# Patient Record
Sex: Female | Born: 1960 | Race: White | Hispanic: No | Marital: Married | Smoking: Former smoker
Health system: Southern US, Community
[De-identification: ages and names within clinical notes are randomized; demographics above are authoritative.]

## PROBLEM LIST (undated history)

## (undated) DIAGNOSIS — F419 Anxiety disorder, unspecified: Secondary | ICD-10-CM

## (undated) DIAGNOSIS — B001 Herpesviral vesicular dermatitis: Secondary | ICD-10-CM

## (undated) DIAGNOSIS — Z9889 Other specified postprocedural states: Secondary | ICD-10-CM

## (undated) DIAGNOSIS — G4730 Sleep apnea, unspecified: Secondary | ICD-10-CM

## (undated) DIAGNOSIS — IMO0002 Reserved for concepts with insufficient information to code with codable children: Secondary | ICD-10-CM

## (undated) HISTORY — DX: Reserved for concepts with insufficient information to code with codable children: IMO0002

## (undated) HISTORY — DX: Herpesviral vesicular dermatitis: B00.1

## (undated) HISTORY — PX: BARIATRIC SURGERY: SHX1103

## (undated) HISTORY — DX: Other specified postprocedural states: Z98.890

## (undated) HISTORY — PX: TUBAL LIGATION: SHX77

## (undated) HISTORY — DX: Sleep apnea, unspecified: G47.30

## (undated) HISTORY — DX: Anxiety disorder, unspecified: F41.9

---

## 1998-06-03 ENCOUNTER — Other Ambulatory Visit: Admission: RE | Admit: 1998-06-03 | Discharge: 1998-06-03 | Payer: Self-pay | Admitting: Obstetrics and Gynecology

## 2001-04-14 ENCOUNTER — Other Ambulatory Visit: Admission: RE | Admit: 2001-04-14 | Discharge: 2001-04-14 | Payer: Self-pay | Admitting: Obstetrics and Gynecology

## 2002-04-24 ENCOUNTER — Encounter: Admission: RE | Admit: 2002-04-24 | Discharge: 2002-04-24 | Payer: Self-pay | Admitting: Obstetrics and Gynecology

## 2002-04-24 ENCOUNTER — Encounter: Payer: Self-pay | Admitting: Obstetrics and Gynecology

## 2003-06-03 ENCOUNTER — Encounter: Payer: Self-pay | Admitting: Obstetrics and Gynecology

## 2003-06-03 ENCOUNTER — Encounter: Admission: RE | Admit: 2003-06-03 | Discharge: 2003-06-03 | Payer: Self-pay | Admitting: Obstetrics and Gynecology

## 2004-06-23 ENCOUNTER — Encounter: Admission: RE | Admit: 2004-06-23 | Discharge: 2004-06-23 | Payer: Self-pay | Admitting: Obstetrics and Gynecology

## 2005-08-27 ENCOUNTER — Encounter: Admission: RE | Admit: 2005-08-27 | Discharge: 2005-08-27 | Payer: Self-pay | Admitting: Obstetrics and Gynecology

## 2006-08-30 ENCOUNTER — Encounter: Admission: RE | Admit: 2006-08-30 | Discharge: 2006-08-30 | Payer: Self-pay | Admitting: Family Medicine

## 2007-09-05 ENCOUNTER — Encounter: Admission: RE | Admit: 2007-09-05 | Discharge: 2007-09-05 | Payer: Self-pay | Admitting: Family Medicine

## 2008-09-10 ENCOUNTER — Encounter: Admission: RE | Admit: 2008-09-10 | Discharge: 2008-09-10 | Payer: Self-pay | Admitting: Family Medicine

## 2009-09-16 ENCOUNTER — Encounter: Admission: RE | Admit: 2009-09-16 | Discharge: 2009-09-16 | Payer: Self-pay | Admitting: Family Medicine

## 2010-09-22 ENCOUNTER — Encounter
Admission: RE | Admit: 2010-09-22 | Discharge: 2010-09-22 | Payer: Self-pay | Source: Home / Self Care | Attending: Family Medicine | Admitting: Family Medicine

## 2011-08-27 ENCOUNTER — Other Ambulatory Visit: Payer: Self-pay | Admitting: Family Medicine

## 2011-08-27 DIAGNOSIS — Z1231 Encounter for screening mammogram for malignant neoplasm of breast: Secondary | ICD-10-CM

## 2011-10-05 ENCOUNTER — Ambulatory Visit: Payer: Self-pay

## 2011-10-12 ENCOUNTER — Ambulatory Visit
Admission: RE | Admit: 2011-10-12 | Discharge: 2011-10-12 | Disposition: A | Discharge status: Home / Self Care | Payer: Federal, State, Local not specified - PPO | Source: Ambulatory Visit | Attending: Family Medicine | Admitting: Family Medicine

## 2011-10-12 DIAGNOSIS — Z1231 Encounter for screening mammogram for malignant neoplasm of breast: Secondary | ICD-10-CM

## 2011-11-02 LAB — HM PAP SMEAR: HM Pap smear: NEGATIVE

## 2011-11-13 DIAGNOSIS — IMO0002 Reserved for concepts with insufficient information to code with codable children: Secondary | ICD-10-CM

## 2011-11-13 HISTORY — DX: Reserved for concepts with insufficient information to code with codable children: IMO0002

## 2011-12-07 DIAGNOSIS — Z9889 Other specified postprocedural states: Secondary | ICD-10-CM

## 2011-12-07 HISTORY — DX: Other specified postprocedural states: Z98.890

## 2012-01-25 LAB — HM COLONOSCOPY

## 2012-09-08 ENCOUNTER — Other Ambulatory Visit: Payer: Self-pay | Admitting: Family Medicine

## 2012-09-08 DIAGNOSIS — Z1231 Encounter for screening mammogram for malignant neoplasm of breast: Secondary | ICD-10-CM

## 2012-10-17 ENCOUNTER — Ambulatory Visit
Admission: RE | Admit: 2012-10-17 | Discharge: 2012-10-17 | Disposition: A | Discharge status: Home / Self Care | Payer: Federal, State, Local not specified - PPO | Source: Ambulatory Visit | Attending: Family Medicine | Admitting: Family Medicine

## 2012-10-17 DIAGNOSIS — Z1231 Encounter for screening mammogram for malignant neoplasm of breast: Secondary | ICD-10-CM

## 2013-01-06 ENCOUNTER — Encounter: Payer: Self-pay | Admitting: Family Medicine

## 2013-01-06 DIAGNOSIS — F419 Anxiety disorder, unspecified: Secondary | ICD-10-CM | POA: Insufficient documentation

## 2013-01-07 ENCOUNTER — Encounter: Payer: Self-pay | Admitting: Physician Assistant

## 2013-01-07 ENCOUNTER — Encounter: Payer: Self-pay | Admitting: Family Medicine

## 2013-01-07 ENCOUNTER — Ambulatory Visit (INDEPENDENT_AMBULATORY_CARE_PROVIDER_SITE_OTHER): Payer: Federal, State, Local not specified - PPO | Admitting: Physician Assistant

## 2013-01-07 VITALS — BP 116/74 | HR 72 | Temp 98.4°F | Resp 18 | Ht 62.0 in | Wt 178.0 lb

## 2013-01-07 DIAGNOSIS — B977 Papillomavirus as the cause of diseases classified elsewhere: Secondary | ICD-10-CM

## 2013-01-07 DIAGNOSIS — Z9889 Other specified postprocedural states: Secondary | ICD-10-CM

## 2013-01-07 DIAGNOSIS — R8789 Other abnormal findings in specimens from female genital organs: Secondary | ICD-10-CM

## 2013-01-07 DIAGNOSIS — IMO0002 Reserved for concepts with insufficient information to code with codable children: Secondary | ICD-10-CM

## 2013-01-07 DIAGNOSIS — F419 Anxiety disorder, unspecified: Secondary | ICD-10-CM

## 2013-01-07 DIAGNOSIS — Z0000 Encounter for general adult medical examination without abnormal findings: Secondary | ICD-10-CM

## 2013-01-07 DIAGNOSIS — F411 Generalized anxiety disorder: Secondary | ICD-10-CM

## 2013-01-07 MED ORDER — BUPROPION HCL ER (XL) 300 MG PO TB24: 300.0000 mg | ORAL_TABLET | Freq: Every day | ORAL | Status: DC

## 2013-01-07 NOTE — Progress Notes (Signed)
Patient ID: Chloe Newton MRN: [id number], DOB: [birth year], 52 y.o. Date of Encounter: 01/07/2013,   Chief Complaint: Physical (CPE)  HPI: 52 y.o. y/o female here with history of noted below here for CPE.  Doing well.  Is on Wellbutrin for anxiety. Says this is working perfectly. Does not want to try to decrease or stop med.   No other complaints.   Review of Systems: Consitutional: No fever, chills, fatigue, night sweats, lymphadenopathy. No significant/unexplained weight changes. Eyes: No visual changes, eye redness, or discharge. ENT/Mouth: No ear pain, sore throat, nasal drainage, or sinus pain. Cardiovascular: No chest pressure,heaviness, tightness or squeezing, even with exertion. No increased shortness of breath or dyspnea on exertion.No palpitations, edema, orthopnea, PND. Respiratory: No cough, hemoptysis, SOB, or wheezing. Gastrointestinal: No anorexia, dysphagia, reflux, pain, nausea, vomiting, hematemesis, diarrhea, constipation, BRBPR, or melena. Breast: No mass, nodules, bulging, or retraction. No skin changes or inflammation. No nipple discharge. No lymphadenopathy. Genitourinary: No dysuria, hematuria, incontinence, vaginal discharge, pruritis, burning, abnormal bleeding, or pain. Musculoskeletal: No decreased ROM, No joint pain or swelling. No significant pain in neck, back, or extremities. Skin: No rash, pruritis, or concerning lesions. Neurological: No headache, dizziness, syncope, seizures, tremors, memory loss, coordination problems, or paresthesias. Psychological: No anxiety, depression, hallucinations, SI/HI. Endocrine: No polydipsia, polyphagia, polyuria, or known diabetes.No increased fatigue. No palpitations/rapid heart rate. No significant/unexplained weight change. All other systems were reviewed and are otherwise negative.  Past Medical History  Diagnosis Date  . Anxiety   . Abnormal finding on Pap smear, HPV DNA positive 11/13/11    Normal Cytology.  Positive HPV 16, 18  . History of colposcopy with cervical biopsy 12/07/11     Past Surgical History  Procedure Laterality Date  . Tubal ligation    . Cesarean section      Home Meds:  No current outpatient prescriptions on file prior to visit.   No current facility-administered medications on file prior to visit.    Allergies: No Known Allergies  History   Social History  . Marital Status: Married    Spouse Name: N/A    Number of Children: N/A  . Years of Education: N/A   Occupational History  . Not on file.   Social History Main Topics  . Smoking status: Former Games developer  . Smokeless tobacco: Not on file  . Alcohol Use: Yes  . Drug Use: No  . Sexually Active: Not on file   Other Topics Concern  . Not on file   Social History Narrative  . No narrative on file    Family History  Problem Relation Age of Onset  . COPD Father   . Diabetes Maternal Grandmother   . Heart disease Maternal Grandfather   . Cancer Paternal Grandmother   . Cancer Paternal Grandfather     testicular  . Hypertension Neg Hx     Physical Exam: Blood pressure 116/74, pulse 72, temperature 98.4 F (36.9 C), temperature source Oral, resp. rate 18, height 5\' 2"  (1.575 m), weight 178 lb (80.74 kg), last menstrual period 12/26/2012., Body mass index is 32.55 kg/(m^2). General: Well developed, well nourished,WF. in no acute distress. HEENT: Normocephalic, atraumatic. Conjunctiva pink, sclera non-icteric. Pupils 2 mm constricting to 1 mm, round, regular, and equally reactive to light and accomodation. EOMI. Internal auditory canal clear. TMs with good cone of light and without pathology. Nasal mucosa pink. Nares are without discharge. No sinus tenderness. Oral mucosa pink.  Pharynx without exudate.   Neck: Supple. Trachea midline. No  thyromegaly. Full ROM. No lymphadenopathy.No Carotid Bruits. Lungs: Clear to auscultation bilaterally without wheezes, rales, or rhonchi. Breathing is of normal effort  and unlabored. Cardiovascular: RRR with S1 S2. No murmurs, rubs, or gallops. Distal pulses 2+ symmetrically. No carotid or abdominal bruits. Breast: Symmetrical. No masses. Nipples without discharge. Abdomen: Soft, non-tender, non-distended with normoactive bowel sounds. No hepatosplenomegaly or masses. No rebound/guarding. No CVA tenderness. No hernias.  Genitourinary:  External genitalia without lesions. Vaginal mucosa pink.No discharge present. Cervix pink and without discharge. No cervical tenderness.Normal uterus size. No adnexal mass or tenderness.  Pap smear taken Musculoskeletal: Full range of motion and 5/5 strength throughout. Without swelling, atrophy, tenderness, crepitus, or warmth. Extremities without clubbing, cyanosis, or edema. Calves supple. Skin: Warm and moist without erythema, ecchymosis, wounds, or rash. Neuro: A+Ox3. CN II-XII grossly intact. Moves all extremities spontaneously. Full sensation throughout. Normal gait. DTR 2+ throughout upper and lower extremities. Finger to nose intact. Psych:  Responds to questions appropriately with a normal affect.    Immunization History  Administered Date(s) Administered  . Tdap 01/11/2006    Assessment/Plan:  52 y.o. y/o female here for CPE 1. Encounter for preventive health examination   Screening Mammogram: 10/17/12 Negative  Screening Colonoscopy: 01/25/12   Pap smear: See Below. Repeat today -cytology and HPV.  Immunizations: Had Tdap 01/11/06  2. Anxiety Well controllled. Cont current med. - buPROPion (WELLBUTRIN XL) 300 MG 24 hr tablet; Take 1 tablet (300 mg total) by mouth daily.  Dispense: 90 tablet; Refill: 3  3. 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 Had f/u colposcopy with Dr Ambrose Mantle - PAP, Thin Prep w/HPV rflx HPV Type 16/18  4. History of colposcopy with cervical biopsy This was done 12/07/11 by Dr Ambrose Mantle. Pt says she has had no f/u since  12/07/11.  5.H/O Recurrent BV-cont Boric Acid intravaginal. Rx given for; Boric Acid Intravaginal Gelatin Capsule 600 mg one Intravaginally QDprn   Signed, [address] [place], [place], [facility] 01/07/2013 10:39 AM

## 2013-01-09 LAB — PAP, THIN PREP W/HPV RFLX HPV TYPE 16/18: HPV DNA High Risk: NOT DETECTED

## 2013-03-09 ENCOUNTER — Encounter: Payer: Self-pay | Admitting: Physician Assistant

## 2013-03-09 ENCOUNTER — Ambulatory Visit (INDEPENDENT_AMBULATORY_CARE_PROVIDER_SITE_OTHER): Payer: Federal, State, Local not specified - PPO | Admitting: Physician Assistant

## 2013-03-09 VITALS — BP 118/80 | HR 68 | Temp 98.3°F | Resp 18 | Ht 63.5 in | Wt 176.0 lb

## 2013-03-09 DIAGNOSIS — M545 Low back pain, unspecified: Secondary | ICD-10-CM

## 2013-03-09 MED ORDER — MELOXICAM 7.5 MG PO TABS: 7.5000 mg | ORAL_TABLET | Freq: Every day | ORAL | Status: DC

## 2013-03-09 MED ORDER — PREDNISONE 20 MG PO TABS: ORAL_TABLET | ORAL | Status: DC

## 2013-03-09 MED ORDER — METAXALONE 800 MG PO TABS: 800.0000 mg | ORAL_TABLET | Freq: Three times a day (TID) | ORAL | Status: DC

## 2013-03-09 NOTE — Progress Notes (Signed)
Patient ID: LECIA ESPERANZA MRN: [id number], DOB: 23-Oct-1960, 52 y.o. Date of Encounter: 03/09/2013, 3:48 PM    Chief Complaint:  Chief Complaint  Patient presents with  . c/o low back pain radiating down into legs     HPI: 52 y.o. year old white female reports that she has no prior h/o back pain or back injury/surgery prior to this episode. This began "around Easter"- late March. Says she had lifted her grandchild 1-2 days prior to onset of symtoms. Other than that, she had no other injury/trauma. Had done no other lifting, bending, or other activity to possibly cause back pain. Had no sudden onset/acute onset of symptoms. Had achy discomfort across low back since Easter. Then, for past one week pain has radiated down back of both thighs to knee level. 2 days ago she started to notice left foot tingling some.  She uses ibuprofen and pain decreases from 4/10 to 2.5/10.  She was walking at least 2 miles QD prior to this but this has limited her exercise. 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 No other acitivity-no lifting, bending, etc.  Her pain is worst when standing still. Pain is better when walking and moving and when sitting, lying down.   No unexplained weight loss. No other area of pain. No bowel or bladder incontinence.   Home Meds: See attached medication section for any medications that were entered at today's visit. The computer does not put those onto this list.The following list is a list of meds entered prior to today's visit.   Current Outpatient Prescriptions on File Prior to Visit  Medication Sig Dispense Refill  . buPROPion (WELLBUTRIN XL) 300 MG 24 hr tablet Take 1 tablet (300 mg total) by mouth daily.  90 tablet  3  . docusate sodium (COLACE) 100 MG capsule Take 100 mg by mouth as needed for constipation.      . Omega-3 Fatty Acids (FISH OIL) 500 MG CAPS Take 1 capsule by mouth daily.        No current facility-administered medications on file prior to visit.    Allergies: No Known Allergies    Review of Systems: See HPI for pertinent ROS. All other ROS negative.    Physical Exam: Blood pressure 118/80, pulse 68, temperature 98.3 F (36.8 C), temperature source Oral, resp. rate 18, height 5' 3.5" (1.613 m), weight 176 lb (79.833 kg), last menstrual period 02/28/2013., Body mass index is 30.68 kg/(m^2). General: WNWD WF. Tanned. Appears in no acute distress. Lungs: Clear bilaterally to auscultation without wheezes, rales, or rhonchi. Breathing is unlabored. Heart: Regular rhythm. No murmurs, rubs, or gallops. Msk:  Strength and tone normal for age. Low Back: No pain with palpation. Left Sciatic Notch: Positive tenderness with palpation. Right Sciatic Notch: no tenderness with palpation.  Left SLR: She says left low back/buttock feels very tight when she does this. Left hip abduction:nml.  Right SLR and hip abduction: nml. 2+ bilateral patella reflexes. 5/5 strength of legs and ankles bialterally Neuro: Alert and oriented X 3. Moves all extremities spontaneously. Gait is normal. CNII-XII grossly in tact. Psych:  Responds to questions appropriately with a normal affect.     ASSESSMENT AND PLAN:  52 y.o. year old female with  1. Low back pain with left sciatica  - predniSONE (DELTASONE) 20 MG tablet; Take 3 daily for 2 days, then 2 daily for 2 days, then 1 daily for  2 days.  Dispense: 12 tablet; Refill: 0 - metaxalone (SKELAXIN) 800 MG tablet; Take 1 tablet (800 mg total) by mouth 3 (three) times daily.  Dispense: 40 tablet; Refill: 1 - meloxicam (MOBIC) 7.5 MG tablet; Take 1 tablet (7.5 mg total) by mouth daily.  Dispense: 30 tablet; Refill: 0  Cautioned reg poss drowsiness with Skelaxin. If this occurs, just use this at night.  She will not start mobic until after prednisone is complete. Take with food.  Heat. Stretch. Demonstrated multiple stretches for her to do.  Also, she has yoga DVD and we discussed specific yoga stretches to do.  If symptoms not resolved in 2 weeks, f/u then will consider XRay if needed.   [id number] [place], [place], [facility] 03/09/2013 3:48 PM

## 2013-10-20 ENCOUNTER — Other Ambulatory Visit: Payer: Self-pay

## 2013-10-20 DIAGNOSIS — Z1231 Encounter for screening mammogram for malignant neoplasm of breast: Secondary | ICD-10-CM

## 2013-11-06 ENCOUNTER — Ambulatory Visit
Admission: RE | Admit: 2013-11-06 | Discharge: 2013-11-06 | Disposition: A | Discharge status: Home / Self Care | Payer: Federal, State, Local not specified - PPO | Source: Ambulatory Visit

## 2013-11-06 DIAGNOSIS — Z1231 Encounter for screening mammogram for malignant neoplasm of breast: Secondary | ICD-10-CM

## 2014-02-23 ENCOUNTER — Other Ambulatory Visit: Payer: Federal, State, Local not specified - PPO

## 2014-02-23 DIAGNOSIS — Z0000 Encounter for general adult medical examination without abnormal findings: Secondary | ICD-10-CM

## 2014-02-23 DIAGNOSIS — Z79899 Other long term (current) drug therapy: Secondary | ICD-10-CM

## 2014-02-23 DIAGNOSIS — E785 Hyperlipidemia, unspecified: Secondary | ICD-10-CM

## 2014-02-23 DIAGNOSIS — F411 Generalized anxiety disorder: Secondary | ICD-10-CM

## 2014-02-23 LAB — COMPLETE METABOLIC PANEL WITH GFR
ALBUMIN: 3.6 g/dL (ref 3.5–5.2)
ALK PHOS: 39 U/L (ref 39–117)
ALT: 41 U/L — ABNORMAL HIGH (ref 0–35)
AST: 49 U/L — AB (ref 0–37)
BILIRUBIN TOTAL: 0.4 mg/dL (ref 0.2–1.2)
BUN: 12 mg/dL (ref 6–23)
CALCIUM: 8.4 mg/dL (ref 8.4–10.5)
CHLORIDE: 109 meq/L (ref 96–112)
CO2: 29 meq/L (ref 19–32)
Creat: 0.77 mg/dL (ref 0.50–1.10)
GFR, Est African American: >89 mL/min
GFR, Est Non African American: 88 mL/min
Glucose, Bld: 82 mg/dL (ref 70–99)
Potassium: 4.4 mEq/L (ref 3.5–5.3)
SODIUM: 143 meq/L (ref 135–145)
Total Protein: 5.8 g/dL — ABNORMAL LOW (ref 6.0–8.3)

## 2014-02-23 LAB — CBC WITH DIFFERENTIAL/PLATELET
BASOS ABS: 0 10*3/uL (ref 0.0–0.1)
BASOS PCT: 0 % (ref 0–1)
EOS ABS: 0.2 10*3/uL (ref 0.0–0.7)
Eosinophils Relative: 3 % (ref 0–5)
HEMATOCRIT: 36.8 % (ref 36.0–46.0)
HEMOGLOBIN: 12.4 g/dL (ref 12.0–15.0)
LYMPHS ABS: 1.4 10*3/uL (ref 0.7–4.0)
LYMPHS PCT: 20 % (ref 12–46)
MCH: 30.2 pg (ref 26.0–34.0)
MCHC: 33.7 g/dL (ref 30.0–36.0)
MCV: 89.8 fL (ref 78.0–100.0)
MONO ABS: 1.1 10*3/uL — AB (ref 0.1–1.0)
MONOS PCT: 16 % — AB (ref 3–12)
Neutro Abs: 4.2 10*3/uL (ref 1.7–7.7)
Neutrophils Relative %: 61 % (ref 43–77)
Platelets: 270 10*3/uL (ref 150–400)
RBC: 4.1 MIL/uL (ref 3.87–5.11)
RDW: 12.9 % (ref 11.5–15.5)
WBC: 6.9 10*3/uL (ref 4.0–10.5)

## 2014-02-23 LAB — LIPID PANEL
CHOL/HDL RATIO: 1.8 ratio
CHOLESTEROL: 147 mg/dL (ref 0–200)
HDL: 80 mg/dL (ref >39)
LDL CALC: 53 mg/dL (ref 0–99)
TRIGLYCERIDES: 70 mg/dL (ref <150)
VLDL: 14 mg/dL (ref 0–40)

## 2014-02-23 LAB — TSH: TSH: 1.765 u[IU]/mL (ref 0.350–4.500)

## 2014-02-24 LAB — VITAMIN D 25 HYDROXY (VIT D DEFICIENCY, FRACTURES): VIT D 25 HYDROXY: 32 ng/mL (ref 30–89)

## 2014-02-26 ENCOUNTER — Telehealth: Payer: Self-pay | Admitting: Physician Assistant

## 2014-02-26 ENCOUNTER — Encounter: Payer: Federal, State, Local not specified - PPO | Admitting: Family Medicine

## 2014-02-26 DIAGNOSIS — F419 Anxiety disorder, unspecified: Secondary | ICD-10-CM

## 2014-02-26 MED ORDER — BUPROPION HCL ER (XL) 300 MG PO TB24: 300.0000 mg | ORAL_TABLET | Freq: Every day | ORAL | Status: DC
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
## 2014-02-26 NOTE — Telephone Encounter (Signed)
One month supply sent

## 2014-03-08 ENCOUNTER — Encounter: Payer: Self-pay | Admitting: Physician Assistant

## 2014-03-08 ENCOUNTER — Ambulatory Visit (INDEPENDENT_AMBULATORY_CARE_PROVIDER_SITE_OTHER): Payer: Federal, State, Local not specified - PPO | Admitting: Physician Assistant

## 2014-03-08 VITALS — BP 124/78 | HR 64 | Temp 97.9°F | Resp 18 | Ht 61.5 in | Wt 192.0 lb

## 2014-03-08 DIAGNOSIS — F419 Anxiety disorder, unspecified: Secondary | ICD-10-CM

## 2014-03-08 DIAGNOSIS — IMO0002 Reserved for concepts with insufficient information to code with codable children: Secondary | ICD-10-CM

## 2014-03-08 DIAGNOSIS — B977 Papillomavirus as the cause of diseases classified elsewhere: Secondary | ICD-10-CM

## 2014-03-08 DIAGNOSIS — E669 Obesity, unspecified: Secondary | ICD-10-CM

## 2014-03-08 DIAGNOSIS — Z0000 Encounter for general adult medical examination without abnormal findings: Secondary | ICD-10-CM

## 2014-03-08 DIAGNOSIS — R8789 Other abnormal findings in specimens from female genital organs: Secondary | ICD-10-CM

## 2014-03-08 DIAGNOSIS — Z9889 Other specified postprocedural states: Secondary | ICD-10-CM

## 2014-03-08 DIAGNOSIS — F411 Generalized anxiety disorder: Secondary | ICD-10-CM

## 2014-03-08 MED ORDER — BUPROPION HCL ER (XL) 300 MG PO TB24: 300.0000 mg | ORAL_TABLET | Freq: Every day | ORAL | Status: DC

## 2014-03-08 MED ORDER — LORCASERIN HCL 10 MG PO TABS: 1.0000 | ORAL_TABLET | Freq: Every day | ORAL | Status: DC

## 2014-03-08 MED ORDER — LORCASERIN HCL 10 MG PO TABS: 1.0000 | ORAL_TABLET | Freq: Two times a day (BID) | ORAL | Status: DC

## 2014-03-08 NOTE — Progress Notes (Signed)
Patient ID: Chloe Newton MRN: [id number], DOB: Apr 07, 1961, 53 y.o. Date of Encounter: 03/08/2014,   Chief Complaint: Physical (CPE)  HPI: 53 y.o. y/o female with history of noted below here for CPE.  Doing well.  Is on Wellbutrin for anxiety. Says this is working perfectly. Does not want to try to decrease or stop med.   Today she does report that she would like to try prescription for [facility] for weight loss. Says that she has done Weight Watchers and is still very compliant with following Weight Watchers. Says that she " tracks everything that goes into her mouth." Says that she is still eating the exact same as she was when she reached her goal with Weight Watchers. Says that that was 3 years ago and she did hit her goal of 152 at that time. Says that her activity has slowed down a little bit compared to then but her food intake is the same. Says that she currently walks 30-45 minutes 3 or 4 days a week. Says that several years ago she was doing this 7 days per week.  No other complaints.   Review of Systems: Consitutional: No fever, chills, fatigue, night sweats, lymphadenopathy. No significant/unexplained weight changes. Eyes: No visual changes, eye redness, or discharge. ENT/Mouth: No ear pain, sore throat, nasal drainage, or sinus pain. Cardiovascular: No chest pressure,heaviness, tightness or squeezing, even with exertion. No increased shortness of breath or dyspnea on exertion.No palpitations, edema, orthopnea, PND. Respiratory: No cough, hemoptysis, SOB, or wheezing. Gastrointestinal: No anorexia, dysphagia, reflux, pain, nausea, vomiting, hematemesis, diarrhea, constipation, BRBPR, or melena. Breast: No mass, nodules, bulging, or retraction. No skin changes or inflammation. No nipple discharge. No lymphadenopathy. Genitourinary: No dysuria, hematuria, incontinence, vaginal discharge, pruritis, burning, abnormal bleeding, or pain. Musculoskeletal: No decreased ROM, No joint  pain or swelling. No significant pain in neck, back, or extremities. Skin: No rash, pruritis, or concerning lesions. Neurological: No headache, dizziness, syncope, seizures, tremors, memory loss, coordination problems, or paresthesias. Psychological: No anxiety, depression, hallucinations, SI/HI. Endocrine: No polydipsia, polyphagia, polyuria, or known diabetes.No increased fatigue. No palpitations/rapid heart rate. No significant/unexplained weight change. All other systems were reviewed and are otherwise negative.  Past Medical History  Diagnosis Date  . Anxiety   . Abnormal finding on Pap smear, HPV DNA positive 11/13/11    Normal Cytology. Positive HPV 16, 18  . History of colposcopy with cervical biopsy 12/07/11     Past Surgical History  Procedure Laterality Date  . Tubal ligation    . Cesarean section      Home Meds:  Current Outpatient Prescriptions on File Prior to Visit  Medication Sig Dispense Refill  . buPROPion (WELLBUTRIN XL) 300 MG 24 hr tablet Take 1 tablet (300 mg total) by mouth daily.  30 tablet  0  . docusate sodium (COLACE) 100 MG capsule Take 100 mg by mouth as needed for constipation.       No current facility-administered medications on file prior to visit.    Allergies: No Known Allergies  History   Social History  . Marital Status: Married    Spouse Name: N/A    Number of Children: N/A  . Years of Education: N/A   Occupational History  . Not on file.   Social History Main Topics  . Smoking status: Former Games developer  . Smokeless tobacco: Never Used  . Alcohol Use: Yes  . Drug Use: No  . Sexual Activity: Not on file   Other Topics Concern  .  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   For exercise she walks 30-45 minutes 3-4 days per week. (As of 03/2014)    Family History  Problem Relation Age of Onset  . COPD Father   . Diabetes Maternal Grandmother   . Heart disease  Maternal Grandfather   . Cancer Paternal Grandmother   . Cancer Paternal Grandfather     testicular  . Hypertension Neg Hx     Physical Exam: Blood pressure 124/78, pulse 64, temperature 97.9 F (36.6 C), temperature source Oral, resp. rate 18, height 5' 1.5" (1.562 m), weight 192 lb (87.091 kg), last menstrual period 02/09/2014., Body mass index is 35.7 kg/(m^2). General: Well developed, well nourished,WF. in no acute distress. HEENT: Normocephalic, atraumatic. Conjunctiva pink, sclera non-icteric. Pupils 2 mm constricting to 1 mm, round, regular, and equally reactive to light and accomodation. EOMI. Internal auditory canal clear. TMs with good cone of light and without pathology. Nasal mucosa pink. Nares are without discharge. No sinus tenderness. Oral mucosa pink.  Pharynx without exudate.   Neck: Supple. Trachea midline. No thyromegaly. Full ROM. No lymphadenopathy.No Carotid Bruits. Lungs: Clear to auscultation bilaterally without wheezes, rales, or rhonchi. Breathing is of normal effort and unlabored. Cardiovascular: RRR with S1 S2. No murmurs, rubs, or gallops. Distal pulses 2+ symmetrically. No carotid or abdominal bruits. Breast: Symmetrical. No masses. Nipples without discharge. Abdomen: Soft, non-tender, non-distended with normoactive bowel sounds. No hepatosplenomegaly or masses. No rebound/guarding. No CVA tenderness. No hernias.  Genitourinary:  External genitalia without lesions. Vaginal mucosa pink.No discharge present. Cervix pink and without discharge. No cervical tenderness.Normal uterus size. No adnexal mass or tenderness.  Pap smear taken Musculoskeletal: Full range of motion and 5/5 strength throughout. Without swelling, atrophy, tenderness, crepitus, or warmth. Extremities without clubbing, cyanosis, or edema. Calves supple. Skin: Warm and moist without erythema, ecchymosis, wounds, or rash. Neuro: A+Ox3. CN II-XII grossly intact. Moves all extremities spontaneously. Full  sensation throughout. Normal gait. DTR 2+ throughout upper and lower extremities. Finger to nose intact. Psych:  Responds to questions appropriately with a normal affect.    Immunization History  Administered Date(s) Administered  . Tdap 01/11/2006    Assessment/Plan:  53 y.o. y/o female here for CPE 1. Encounter for preventive health examination   Screening Labs: She came in had fasting labs on 02/23/14. All labs were normal. Included CBC, CMET, FLP, TSH, vitamin D.  Screening Mammogram: 11/06/2013-- Negative  Screening Colonoscopy: 01/25/12   Pap smear: See Below. Repeat today -cytology and HPV.  Immunizations: Had Tdap 01/11/06  2. Anxiety Well controllled. Cont current med. - buPROPion (WELLBUTRIN XL) 300 MG 24 hr tablet; Take 1 tablet (300 mg total) by mouth daily.  Dispense: 90 tablet; Refill: 3  3. 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 Had f/u colposcopy with Dr Ambrose Mantle - PAP, Thin Prep w/HPV rflx HPV Type 16/18  4. History of colposcopy with cervical biopsy This was done 12/07/11 by Dr Ambrose Mantle. Pt says she has had no f/u since 12/07/11.  5.H/O Recurrent BV-cont Boric Acid intravaginal. Rx given for; Boric Acid Intravaginal Gelatin Capsule 600 mg one Intravaginally QDprn  6. Obesity (BMI 30-39.9) - Lorcaserin HCl (BELVIQ) 10 MG TABS; Take 1 tablet by mouth 2 (two) times daily.  Dispense: 60 tablet; Refill: 2 Told her to continue her diet and exercise the same as at present  so that we can monitor the effectiveness of the Belviq. She is to schedule followup office visit in 3 months so that we can follow up her weight and the Belviq.   [address]igned, [place] Beth Escobares[place], [place], [facility] 03/08/2014 2:21 PM

## 2014-03-10 ENCOUNTER — Encounter: Payer: Self-pay | Admitting: Family Medicine

## 2014-03-10 LAB — PAP, THIN PREP W/HPV RFLX HPV TYPE 16/18: HPV DNA High Risk: NOT DETECTED

## 2014-03-22 ENCOUNTER — Encounter: Payer: Self-pay | Admitting: Physician Assistant

## 2014-03-22 ENCOUNTER — Ambulatory Visit (INDEPENDENT_AMBULATORY_CARE_PROVIDER_SITE_OTHER): Payer: Federal, State, Local not specified - PPO | Admitting: Physician Assistant

## 2014-03-22 VITALS — BP 142/84 | HR 72 | Temp 98.3°F | Resp 18 | Wt 189.0 lb

## 2014-03-22 DIAGNOSIS — A499 Bacterial infection, unspecified: Secondary | ICD-10-CM

## 2014-03-22 DIAGNOSIS — J988 Other specified respiratory disorders: Secondary | ICD-10-CM

## 2014-03-22 DIAGNOSIS — H109 Unspecified conjunctivitis: Secondary | ICD-10-CM

## 2014-03-22 DIAGNOSIS — J029 Acute pharyngitis, unspecified: Secondary | ICD-10-CM

## 2014-03-22 DIAGNOSIS — B9689 Other specified bacterial agents as the cause of diseases classified elsewhere: Secondary | ICD-10-CM

## 2014-03-22 LAB — RAPID STREP SCREEN (MED CTR MEBANE ONLY): Streptococcus, Group A Screen (Direct): NEGATIVE

## 2014-03-22 MED ORDER — SULFACETAMIDE SODIUM 10 % OP SOLN: 2.0000 [drp] | OPHTHALMIC | Status: DC

## 2014-03-22 MED ORDER — AZITHROMYCIN 250 MG PO TABS: ORAL_TABLET | ORAL | Status: DC

## 2014-03-22 NOTE — Progress Notes (Signed)
Patient ID: Chloe Newton MRN: [id number], DOB: 08/29/1961, 53 y.o. Date of Encounter: 03/22/2014, 12:37 PM    Chief Complaint:  Chief Complaint  Patient presents with  . c/o strep throat and pink eye    husband had strep few weeks ago, left eye red and draining     HPI: 53 y.o. year old white female reports that her throat started to be sore on Thursday 03/18/14. Says it is still sore and is not getting any better. Says that she has a cough productive of thick dark phlegm. Says she really isn't getting anything from her nose. Has had no known fever. Says that her left eye started getting red and irritated on Saturday 03/20/14. It has been stuck together in the mornings with crust and she has had some mucousy drainage coming from the left eye.  Says her husband was diagnosed and treated for strep throat about 2 weeks ago.     Home Meds:   Outpatient Prescriptions Prior to Visit  Medication Sig Dispense Refill  . buPROPion (WELLBUTRIN XL) 300 MG 24 hr tablet Take 1 tablet (300 mg total) by mouth daily.  90 tablet  3  . docusate sodium (COLACE) 100 MG capsule Take 100 mg by mouth as needed for constipation.      . Lorcaserin HCl (BELVIQ) 10 MG TABS Take 1 tablet by mouth 2 (two) times daily.  60 tablet  2   No facility-administered medications prior to visit.    Allergies: No Known Allergies    Review of Systems: See HPI for pertinent ROS. All other ROS negative.    Physical Exam: Blood pressure 142/84, pulse 72, temperature 98.3 F (36.8 C), temperature source Oral, resp. rate 18, weight 189 lb (85.73 kg), last menstrual period 02/09/2014., Body mass index is 35.14 kg/(m^2). General: WNWD WF.  Appears in no acute distress. HEENT: Normocephalic, atraumatic, eyes without discharge, sclera non-icteric, nares are without discharge. Bilateral auditory canals clear, TM's are without perforation, pearly grey and translucent with reflective cone of light bilaterally. Posterior  aspect of roof of mouth, Right > Left, is inflamed.  Posterior pharynx without exudate, erythema, peritonsillar abscess. Left Eye: Diffuse conjunctival injection. Right Eye: Normal. There is no rash on the skin surrounding the to suggest herpes zoster. Neck: Supple. No thyromegaly. No lymphadenopathy. She reports that no cervical or tonsillar nodes are tender with palpation and I do not feel any of these being enlarged with palpation. Lungs: Clear bilaterally to auscultation without wheezes, rales, or rhonchi. Breathing is unlabored. Heart: Regular rhythm. No murmurs, rubs, or gallops. Msk:  Strength and tone normal for age. Extremities/Skin: Warm and dry. No rash. Neuro: Alert and oriented X 3. Moves all extremities spontaneously. Gait is normal. CNII-XII grossly in tact. Psych:  Responds to questions appropriately with a normal affect.   Results for orders placed in visit on 03/22/14  RAPID STREP SCREEN      Result Value Ref Range   Source THROAT     Streptococcus, Group A Screen (Direct) NEG  NEGATIVE     ASSESSMENT AND PLAN:  53 y.o. year old female with  1. Conjunctivitis of left eye - sulfacetamide (BLEPH-10) 10 % ophthalmic solution; Place 2 drops into the left eye every 3 (three) hours while awake.  Dispense: 10 mL; Refill: 0  2. Bacterial respiratory infection Discussed with her that I really think that her pharyngitis and cough or viral. I recommend that she monitor her symptoms for another couple of days.  If symptoms improve then I think this is a virus and it should resolve spontaneously. Not improved in another 48 hours then more likely to be bacterial then she can fill the antibiotic and take it as directed.  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 She does not want to get them sick and is requesting note to be out of work through that day. Will give her a note for out of work 6/22 - 03/25/14. F/U if  her  symptoms worsen significantly or if she ends up taking the antibiotic and symptoms do not resolve within one week after completion of antibiotic. - azithromycin (ZITHROMAX) 250 MG tablet; Day 1: Take 2 daily.  Days 2-5: Take 1 daily.  Dispense: 6 tablet; Refill: 0  3. Sorethroat - Rapid Strep Screen   Signed, Shon HaleMary Beth LenapahDixon, [place], [facility] 03/22/2014 12:37 PM

## 2014-07-16 ENCOUNTER — Other Ambulatory Visit: Payer: Self-pay

## 2014-10-28 ENCOUNTER — Other Ambulatory Visit: Payer: Self-pay

## 2014-10-28 DIAGNOSIS — Z1231 Encounter for screening mammogram for malignant neoplasm of breast: Secondary | ICD-10-CM

## 2014-11-19 ENCOUNTER — Ambulatory Visit: Payer: Federal, State, Local not specified - PPO

## 2014-11-26 ENCOUNTER — Ambulatory Visit
Admission: RE | Admit: 2014-11-26 | Discharge: 2014-11-26 | Disposition: A | Discharge status: Home / Self Care | Payer: Federal, State, Local not specified - PPO | Source: Ambulatory Visit

## 2014-11-26 DIAGNOSIS — Z1231 Encounter for screening mammogram for malignant neoplasm of breast: Secondary | ICD-10-CM

## 2015-03-07 ENCOUNTER — Other Ambulatory Visit: Payer: Self-pay | Admitting: Physician Assistant

## 2015-03-18 ENCOUNTER — Ambulatory Visit (INDEPENDENT_AMBULATORY_CARE_PROVIDER_SITE_OTHER): Payer: Federal, State, Local not specified - PPO | Admitting: Family Medicine

## 2015-03-18 ENCOUNTER — Encounter: Payer: Self-pay | Admitting: Family Medicine

## 2015-03-18 VITALS — BP 110/60 | HR 70 | Temp 98.3°F | Resp 20 | Ht 62.0 in | Wt 182.0 lb

## 2015-03-18 DIAGNOSIS — Z0000 Encounter for general adult medical examination without abnormal findings: Secondary | ICD-10-CM | POA: Diagnosis not present

## 2015-03-18 DIAGNOSIS — F419 Anxiety disorder, unspecified: Secondary | ICD-10-CM | POA: Diagnosis not present

## 2015-03-18 LAB — CBC WITH DIFFERENTIAL/PLATELET
BASOS PCT: 0 % (ref 0–1)
Basophils Absolute: 0 10*3/uL (ref 0.0–0.1)
Eosinophils Absolute: 0.1 10*3/uL (ref 0.0–0.7)
Eosinophils Relative: 1 % (ref 0–5)
HEMATOCRIT: 38.5 % (ref 36.0–46.0)
Hemoglobin: 12.8 g/dL (ref 12.0–15.0)
Lymphocytes Relative: 21 % (ref 12–46)
Lymphs Abs: 1.8 10*3/uL (ref 0.7–4.0)
MCH: 30.4 pg (ref 26.0–34.0)
MCHC: 33.2 g/dL (ref 30.0–36.0)
MCV: 91.4 fL (ref 78.0–100.0)
MONO ABS: 0.8 10*3/uL (ref 0.1–1.0)
MONOS PCT: 9 % (ref 3–12)
MPV: 9.3 fL (ref 8.6–12.4)
NEUTROS ABS: 6.1 10*3/uL (ref 1.7–7.7)
Neutrophils Relative %: 69 % (ref 43–77)
Platelets: 274 10*3/uL (ref 150–400)
RBC: 4.21 MIL/uL (ref 3.87–5.11)
RDW: 12.7 % (ref 11.5–15.5)
WBC: 8.8 10*3/uL (ref 4.0–10.5)

## 2015-03-18 LAB — COMPLETE METABOLIC PANEL WITH GFR
ALT: 21 U/L (ref 0–35)
AST: 26 U/L (ref 0–37)
Albumin: 3.5 g/dL (ref 3.5–5.2)
Alkaline Phosphatase: 43 U/L (ref 39–117)
BUN: 16 mg/dL (ref 6–23)
CHLORIDE: 104 meq/L (ref 96–112)
CO2: 28 mEq/L (ref 19–32)
CREATININE: 0.98 mg/dL (ref 0.50–1.10)
Calcium: 8.6 mg/dL (ref 8.4–10.5)
GFR, EST NON AFRICAN AMERICAN: 65 mL/min
GFR, Est African American: 75 mL/min
GLUCOSE: 81 mg/dL (ref 70–99)
Potassium: 4.2 mEq/L (ref 3.5–5.3)
Sodium: 140 mEq/L (ref 135–145)
Total Bilirubin: 0.3 mg/dL (ref 0.2–1.2)
Total Protein: 6 g/dL (ref 6.0–8.3)

## 2015-03-18 MED ORDER — BUPROPION HCL ER (XL) 300 MG PO TB24: 300.0000 mg | ORAL_TABLET | Freq: Every day | ORAL | Status: DC

## 2015-03-18 NOTE — Progress Notes (Signed)
Subjective:    Patient ID: Chloe Newton, female    DOB: May 27, 1961, 54 y.o.   MRN: [id number]  HPI Patient is a very pleasant 54 year old white female here today for complete physical exam. She has a history of an abnormal Pap smear status post colposcopy. She is due again for Pap smear this year. Pap smear last year was normal. Mammogram was performed in February and is up-to-date. Colonoscopy was performed in 2013 and was significant for polyps. She is not due again until 2018. Tetanus shot is up-to-date. Patient is due for lab work. Last year labs are significant for mild elevation in her liver function test. Past Medical History  Diagnosis Date  . Anxiety   . Abnormal finding on Pap smear, HPV DNA positive 11/13/11    Normal Cytology. Positive HPV 16, 18  . History of colposcopy with cervical biopsy 12/07/11   Past Surgical History  Procedure Laterality Date  . Tubal ligation    . Cesarean section     No current outpatient prescriptions on file prior to visit.   No current facility-administered medications on file prior to visit.   No Known Allergies History   Social History  . Marital Status: Married    Spouse Name: N/A  . Number of Children: N/A  . Years of Education: N/A   Occupational History  . Not on file.   Social History Main Topics  . Smoking status: Former Games developer  . Smokeless tobacco: Never Used  . Alcohol Use: Yes  . Drug Use: No  . Sexual Activity: Not on file   Other Topics Concern  . 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   For exercise she walks 30-45 minutes 3-4 days per week. (As of 03/2014)   Family History  Problem Relation Age of Onset  . COPD Father   . Diabetes Maternal Grandmother   . Heart disease Maternal Grandfather   . Cancer Paternal Grandmother   . Cancer Paternal Grandfather     testicular  . Hypertension Neg Hx       Review of Systems  All other  systems reviewed and are negative.      Objective:   Physical Exam  Constitutional: She is oriented to person, place, and time. She appears well-developed and well-nourished. No distress.  HENT:  Head: Normocephalic and atraumatic.  Right Ear: External ear normal.  Left Ear: External ear normal.  Nose: Nose normal.  Mouth/Throat: Oropharynx is clear and moist. No oropharyngeal exudate.  Eyes: Conjunctivae and EOM are normal. Pupils are equal, round, and reactive to light. Right eye exhibits no discharge. Left eye exhibits no discharge. No scleral icterus.  Neck: Normal range of motion. Neck supple. No JVD present. No tracheal deviation present. No thyromegaly present.  Cardiovascular: Normal rate, regular rhythm, normal heart sounds and intact distal pulses.  Exam reveals no gallop and no friction rub.   No murmur heard. Pulmonary/Chest: Effort normal and breath sounds normal. No stridor. No respiratory distress. She has no wheezes. She has no rales. She exhibits no tenderness.  Abdominal: Soft. Bowel sounds are normal. She exhibits no distension and no mass. There is no tenderness. There is no rebound and no guarding.  Genitourinary: Vagina normal and uterus normal. No vaginal discharge found.  Musculoskeletal: Normal range of motion. She exhibits no edema or tenderness.  Lymphadenopathy:    She has no cervical adenopathy.  Neurological: She  is alert and oriented to person, place, and time. She has normal reflexes. She displays normal reflexes. No cranial nerve deficit. She exhibits normal muscle tone. Coordination normal.  Skin: Skin is warm. No rash noted. She is not diaphoretic. No erythema. No pallor.  Psychiatric: She has a normal mood and affect. Her behavior is normal. Judgment and thought content normal.  Vitals reviewed.         Assessment & Plan:  Routine general medical examination at a health care facility - Plan: CBC with Differential/Platelet, COMPLETE METABOLIC PANEL  WITH GFR, PAP, Thin Prep w/HPV rflx HPV Type 16/18  Anxiety - Plan: buPROPion (WELLBUTRIN XL) 300 MG 24 hr tablet  Physical exam is normal. I will check lab work including a CBC and a CMP. Mammogram is up-to-date. Colonoscopy is up-to-date. Pap smear was sent to pathology. Breast exam was performed today and is normal. I did recommend 1000 g a day of calcium and thousand units a day of vitamin D

## 2015-03-22 LAB — PAP, THIN PREP W/HPV RFLX HPV TYPE 16/18: HPV DNA High Risk: NOT DETECTED

## 2015-03-23 ENCOUNTER — Encounter: Payer: Self-pay | Admitting: Family Medicine

## 2015-12-12 ENCOUNTER — Other Ambulatory Visit: Payer: Self-pay

## 2015-12-12 DIAGNOSIS — Z1231 Encounter for screening mammogram for malignant neoplasm of breast: Secondary | ICD-10-CM

## 2015-12-30 ENCOUNTER — Ambulatory Visit: Payer: Federal, State, Local not specified - PPO

## 2016-02-10 ENCOUNTER — Ambulatory Visit
Admission: RE | Admit: 2016-02-10 | Discharge: 2016-02-10 | Disposition: A | Discharge status: Home / Self Care | Payer: Federal, State, Local not specified - PPO | Source: Ambulatory Visit

## 2016-02-10 DIAGNOSIS — Z1231 Encounter for screening mammogram for malignant neoplasm of breast: Secondary | ICD-10-CM

## 2016-05-23 ENCOUNTER — Encounter: Payer: Self-pay | Admitting: Physician Assistant

## 2016-05-23 ENCOUNTER — Ambulatory Visit (INDEPENDENT_AMBULATORY_CARE_PROVIDER_SITE_OTHER): Payer: Federal, State, Local not specified - PPO | Admitting: Physician Assistant

## 2016-05-23 VITALS — BP 112/68 | HR 62 | Temp 98.1°F | Wt 211.0 lb

## 2016-05-23 DIAGNOSIS — Z0000 Encounter for general adult medical examination without abnormal findings: Secondary | ICD-10-CM

## 2016-05-23 DIAGNOSIS — Z23 Encounter for immunization: Secondary | ICD-10-CM | POA: Diagnosis not present

## 2016-05-23 DIAGNOSIS — R8789 Other abnormal findings in specimens from female genital organs: Secondary | ICD-10-CM | POA: Diagnosis not present

## 2016-05-23 DIAGNOSIS — B977 Papillomavirus as the cause of diseases classified elsewhere: Secondary | ICD-10-CM

## 2016-05-23 DIAGNOSIS — Z9889 Other specified postprocedural states: Secondary | ICD-10-CM | POA: Diagnosis not present

## 2016-05-23 DIAGNOSIS — F419 Anxiety disorder, unspecified: Secondary | ICD-10-CM | POA: Diagnosis not present

## 2016-05-23 DIAGNOSIS — IMO0002 Reserved for concepts with insufficient information to code with codable children: Secondary | ICD-10-CM

## 2016-05-23 MED ORDER — BUPROPION HCL ER (XL) 300 MG PO TB24: 300.0000 mg | ORAL_TABLET | Freq: Every day | ORAL | 3 refills | Status: DC

## 2016-05-23 NOTE — Progress Notes (Signed)
Patient ID: Chloe PurlFrances T Newton MRN: [id number], DOB: 01/04/1961, 55 y.o. Date of Encounter: 05/23/2016,   Chief Complaint: Physical (CPE)  HPI: 55 y.o. y/o female with history of noted below here for CPE.  Doing well.  Is on Wellbutrin for anxiety. Says this is working perfectly. Does not want to try to decrease or stop med.   For years now, she has worked on trying to control her weight. She was doing Weight Watchers when I saw her a couple of years ago. Today she says that she tracks her steps and her wrist band goes off each hour and reminds her to get up and walk hourly.  Says she walks anyway on her job and then when her wrist band reminder goes off, she will walk even more.  She tracks her steps and makes sure that she gets 10,000 steps daily. This is her only exercise.  No other complaints/ concerns today.   Review of Systems: Consitutional: No fever, chills, fatigue, night sweats, lymphadenopathy. No significant/unexplained weight changes. Eyes: No visual changes, eye redness, or discharge. ENT/Mouth: No ear pain, sore throat, nasal drainage, or sinus pain. Cardiovascular: No chest pressure,heaviness, tightness or squeezing, even with exertion. No increased shortness of breath or dyspnea on exertion.No palpitations, edema, orthopnea, PND. Respiratory: No cough, hemoptysis, SOB, or wheezing. Gastrointestinal: No anorexia, dysphagia, reflux, pain, nausea, vomiting, hematemesis, diarrhea, constipation, BRBPR, or melena. Breast: No mass, nodules, bulging, or retraction. No skin changes or inflammation. No nipple discharge. No lymphadenopathy. Genitourinary: No dysuria, hematuria, incontinence, vaginal discharge, pruritis, burning, abnormal bleeding, or pain. Musculoskeletal: No decreased ROM, No joint pain or swelling. No significant pain in neck, back, or extremities. Skin: No rash, pruritis, or concerning lesions. Neurological: No headache, dizziness, syncope, seizures, tremors,  memory loss, coordination problems, or paresthesias. Psychological: No depression, hallucinations, SI/HI. Endocrine: No polydipsia, polyphagia, polyuria, or known diabetes.No increased fatigue. No palpitations/rapid heart rate. No significant/unexplained weight change. All other systems were reviewed and are otherwise negative.  Past Medical History:  Diagnosis Date  . Abnormal finding on Pap smear, HPV DNA positive 11/13/11   Normal Cytology. Positive HPV 16, 18  . Anxiety   . History of colposcopy with cervical biopsy 12/07/11     Past Surgical History:  Procedure Laterality Date  . CESAREAN SECTION    . TUBAL LIGATION      Home Meds:  Current Outpatient Prescriptions on File Prior to Visit  Medication Sig Dispense Refill  . buPROPion (WELLBUTRIN XL) 300 MG 24 hr tablet Take 1 tablet (300 mg total) by mouth daily. 90 tablet 3   No current facility-administered medications on file prior to visit.     Allergies: No Known Allergies  Social History   Social History  . Marital status: Married    Spouse name: N/A  . Number of children: N/A  . Years of education: N/A   Occupational History  . Not on file.   Social History Main Topics  . Smoking status: Former Games developermoker  . Smokeless tobacco: Never Used  . Alcohol use Yes  . Drug use: No  . Sexual activity: Not on file   Other Topics Concern  . 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   For exercise she walks 30-45 minutes 3-4 days per week. (As of 03/2014)    Family History  Problem Relation Age of Onset  . COPD Father   . Heart disease Maternal  Grandfather   . Diabetes Maternal Grandmother   . Cancer Paternal Grandmother   . Cancer Paternal Grandfather     testicular  . Hypertension Neg Hx     Physical Exam: Blood pressure 112/68, pulse 62, temperature 98.1 F (36.7 C), weight 211 lb (95.7 kg)., Body mass index is 38.59 kg/m. General: Mildly  obese WF. in no acute distress. HEENT: Normocephalic, atraumatic. Conjunctiva pink, sclera non-icteric. Pupils 2 mm constricting to 1 mm, round, regular, and equally reactive to light and accomodation. EOMI. Internal auditory canal clear. TMs with good cone of light and without pathology. Nasal mucosa pink. Nares are without discharge. No sinus tenderness. Oral mucosa pink.  Pharynx without exudate.   Neck: Supple. Trachea midline. No thyromegaly. Full ROM. No lymphadenopathy.No Carotid Bruits. Lungs: Clear to auscultation bilaterally without wheezes, rales, or rhonchi. Breathing is of normal effort and unlabored. Cardiovascular: RRR with S1 S2. No murmurs, rubs, or gallops. Distal pulses 2+ symmetrically. No carotid or abdominal bruits. Breast: Symmetrical. No masses. Nipples without discharge. Abdomen: Soft, non-tender, non-distended with normoactive bowel sounds. No hepatosplenomegaly or masses. No rebound/guarding. No CVA tenderness. No hernias.  Genitourinary:  External genitalia without lesions. Vaginal mucosa pink.No discharge present. Cervix pink and without discharge. No cervical tenderness.Normal uterus size. No adnexal mass or tenderness.  Pap smear taken Musculoskeletal: Full range of motion and 5/5 strength throughout.  Skin: Warm and moist without erythema, ecchymosis, wounds, or rash. Neuro: A+Ox3. CN II-XII grossly intact. Moves all extremities spontaneously. Full sensation throughout. Normal gait. DTR 2+ throughout upper and lower extremities. Finger to nose intact. Psych:  Responds to questions appropriately with a normal affect.      Assessment/Plan:  55 y.o. y/o female here for CPE 1. Encounter for preventive health examination   Screening Labs: She says that she can easily return fasting on Friday morning for labs and will return at that time for labs.  Screening Mammogram: 02/10/2016-- Negative  Screening Colonoscopy: 01/25/12---Dr. Loreta AveMann. Polyp. Per patient ---says that  she was told to repeat 5 years. Will of her follow-up April 2018.  Pap smear: See Below. Repeat today -cytology and HPV.  Immunizations: Had Tdap 01/11/06. Has been 10 years. Will update today. Tdap given here 05/23/2016  2. Anxiety Well controllled. Cont current med. - buPROPion (WELLBUTRIN XL) 300 MG 24 hr tablet; Take 1 tablet (300 mg total) by mouth daily.  Dispense: 90 tablet; Refill: 3  3. 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 Had f/u colposcopy with Dr Ambrose MantleHenley. Has had normal pap smears hear since then. Repeat pap now.  - PAP, Thin Prep w/HPV rflx HPV Type 16/18  4. History of colposcopy with cervical biopsy This was done 12/07/11 by Dr Ambrose MantleHenley.  Pap 11/05/11 with nml cytology but positive HPV 16, 18. Had f/u colposcopy with Dr Ambrose MantleHenley. Has had normal pap smears hear since then. Repeat pap now.  - PAP, Thin Prep w/HPV rflx HPV Type 16/18    Signed, [address] Springfield[place], [place], [facility] 05/23/2016 8:55 AM

## 2016-05-23 NOTE — Addendum Note (Signed)
Addended by: Rushie ChestnutMCINTYRE, DIRECE E on: 05/23/2016 09:17 AM   Modules accepted: Orders

## 2016-05-25 ENCOUNTER — Other Ambulatory Visit: Payer: Federal, State, Local not specified - PPO

## 2016-05-25 DIAGNOSIS — Z0000 Encounter for general adult medical examination without abnormal findings: Secondary | ICD-10-CM

## 2016-05-25 LAB — COMPLETE METABOLIC PANEL WITH GFR
ALT: 24 U/L (ref 6–29)
AST: 32 U/L (ref 10–35)
Albumin: 3.4 g/dL — ABNORMAL LOW (ref 3.6–5.1)
Alkaline Phosphatase: 48 U/L (ref 33–130)
BUN: 15 mg/dL (ref 7–25)
CHLORIDE: 105 mmol/L (ref 98–110)
CO2: 25 mmol/L (ref 20–31)
CREATININE: 0.82 mg/dL (ref 0.50–1.05)
Calcium: 8.9 mg/dL (ref 8.6–10.4)
GFR, Est Non African American: 80 mL/min (ref >=60)
Glucose, Bld: 72 mg/dL (ref 70–99)
POTASSIUM: 4.4 mmol/L (ref 3.5–5.3)
SODIUM: 141 mmol/L (ref 135–146)
Total Bilirubin: 0.4 mg/dL (ref 0.2–1.2)
Total Protein: 5.9 g/dL — ABNORMAL LOW (ref 6.1–8.1)

## 2016-05-25 LAB — CBC WITH DIFFERENTIAL/PLATELET
BASOS ABS: 0 {cells}/uL (ref 0–200)
BASOS PCT: 0 %
EOS ABS: 216 {cells}/uL (ref 15–500)
Eosinophils Relative: 4 %
HEMATOCRIT: 39.5 % (ref 35.0–45.0)
Hemoglobin: 13.1 g/dL (ref 12.0–15.0)
LYMPHS PCT: 21 %
Lymphs Abs: 1134 cells/uL (ref 850–3900)
MCH: 30.3 pg (ref 27.0–33.0)
MCHC: 33.2 g/dL (ref 32.0–36.0)
MCV: 91.4 fL (ref 80.0–100.0)
MONO ABS: 1080 {cells}/uL — AB (ref 200–950)
MPV: 9.3 fL (ref 7.5–12.5)
Monocytes Relative: 20 %
Neutro Abs: 2970 cells/uL (ref 1500–7800)
Neutrophils Relative %: 55 %
Platelets: 233 10*3/uL (ref 140–400)
RBC: 4.32 MIL/uL (ref 3.80–5.10)
RDW: 13 % (ref 11.0–15.0)
WBC: 5.4 10*3/uL (ref 3.8–10.8)

## 2016-05-25 LAB — PAP, THIN PREP W/HPV RFLX HPV TYPE 16/18: HPV DNA HIGH RISK: NOT DETECTED

## 2016-05-25 LAB — TSH: TSH: 1.82 m[IU]/L

## 2016-05-25 LAB — LIPID PANEL
CHOL/HDL RATIO: 2 ratio (ref <=5.0)
Cholesterol: 179 mg/dL (ref 125–200)
HDL: 89 mg/dL (ref >=46)
LDL CALC: 81 mg/dL (ref <130)
TRIGLYCERIDES: 45 mg/dL (ref <150)
VLDL: 9 mg/dL (ref <30)

## 2016-05-26 LAB — VITAMIN D 25 HYDROXY (VIT D DEFICIENCY, FRACTURES): Vit D, 25-Hydroxy: 27 ng/mL — ABNORMAL LOW (ref 30–100)

## 2016-05-30 ENCOUNTER — Telehealth: Payer: Self-pay

## 2016-05-30 NOTE — Telephone Encounter (Signed)
Spoke with pt provided lab results  

## 2016-05-30 NOTE — Telephone Encounter (Signed)
This encounter was created in error - please disregard.

## 2016-09-04 DIAGNOSIS — K080 Exfoliation of teeth due to systemic causes: Secondary | ICD-10-CM | POA: Diagnosis not present

## 2016-10-23 DIAGNOSIS — K080 Exfoliation of teeth due to systemic causes: Secondary | ICD-10-CM | POA: Diagnosis not present

## 2016-10-26 DIAGNOSIS — K080 Exfoliation of teeth due to systemic causes: Secondary | ICD-10-CM | POA: Diagnosis not present

## 2017-02-14 ENCOUNTER — Other Ambulatory Visit: Payer: Self-pay | Admitting: Family Medicine

## 2017-02-14 DIAGNOSIS — Z1231 Encounter for screening mammogram for malignant neoplasm of breast: Secondary | ICD-10-CM

## 2017-03-01 ENCOUNTER — Ambulatory Visit
Admission: RE | Admit: 2017-03-01 | Discharge: 2017-03-01 | Disposition: A | Discharge status: Home / Self Care | Payer: Federal, State, Local not specified - PPO | Source: Ambulatory Visit | Attending: Family Medicine | Admitting: Family Medicine

## 2017-03-01 DIAGNOSIS — Z1231 Encounter for screening mammogram for malignant neoplasm of breast: Secondary | ICD-10-CM

## 2017-03-05 ENCOUNTER — Other Ambulatory Visit: Payer: Self-pay | Admitting: Family Medicine

## 2017-03-05 DIAGNOSIS — R928 Other abnormal and inconclusive findings on diagnostic imaging of breast: Secondary | ICD-10-CM

## 2017-03-06 DIAGNOSIS — K080 Exfoliation of teeth due to systemic causes: Secondary | ICD-10-CM | POA: Diagnosis not present

## 2017-03-08 ENCOUNTER — Other Ambulatory Visit: Payer: Self-pay | Admitting: Family Medicine

## 2017-03-08 ENCOUNTER — Ambulatory Visit
Admission: RE | Admit: 2017-03-08 | Discharge: 2017-03-08 | Disposition: A | Discharge status: Home / Self Care | Payer: Federal, State, Local not specified - PPO | Source: Ambulatory Visit | Attending: Family Medicine | Admitting: Family Medicine

## 2017-03-08 DIAGNOSIS — R928 Other abnormal and inconclusive findings on diagnostic imaging of breast: Secondary | ICD-10-CM | POA: Diagnosis not present

## 2017-03-08 DIAGNOSIS — N6310 Unspecified lump in the right breast, unspecified quadrant: Secondary | ICD-10-CM

## 2017-03-08 DIAGNOSIS — N6311 Unspecified lump in the right breast, upper outer quadrant: Secondary | ICD-10-CM | POA: Diagnosis not present

## 2017-03-08 DIAGNOSIS — N6312 Unspecified lump in the right breast, upper inner quadrant: Secondary | ICD-10-CM | POA: Diagnosis not present

## 2017-07-11 ENCOUNTER — Encounter: Payer: Self-pay | Admitting: Physician Assistant

## 2017-07-11 ENCOUNTER — Ambulatory Visit (INDEPENDENT_AMBULATORY_CARE_PROVIDER_SITE_OTHER): Payer: Federal, State, Local not specified - PPO | Admitting: Physician Assistant

## 2017-07-11 VITALS — BP 124/68 | HR 86 | Temp 98.7°F | Resp 14 | Ht 62.0 in | Wt 209.0 lb

## 2017-07-11 DIAGNOSIS — Z0000 Encounter for general adult medical examination without abnormal findings: Secondary | ICD-10-CM

## 2017-07-11 DIAGNOSIS — Z114 Encounter for screening for human immunodeficiency virus [HIV]: Secondary | ICD-10-CM

## 2017-07-11 DIAGNOSIS — F419 Anxiety disorder, unspecified: Secondary | ICD-10-CM | POA: Diagnosis not present

## 2017-07-11 DIAGNOSIS — Z1159 Encounter for screening for other viral diseases: Secondary | ICD-10-CM

## 2017-07-11 MED ORDER — BUPROPION HCL ER (XL) 300 MG PO TB24: 300.0000 mg | ORAL_TABLET | Freq: Every day | ORAL | 3 refills | Status: DC

## 2017-07-11 NOTE — Progress Notes (Addendum)
Patient ID: Chloe Newton MRN: [id number], DOB: [birth year], 56 y.o. Date of Encounter: 07/11/2017,   Chief Complaint: Physical (CPE)  HPI: 56 y.o. y/o female here for CPE.   Doing well.  Is on Wellbutrin for anxiety. Says this is working perfectly. Does not want to try to decrease or stop med.   For years now, she has worked on trying to control her weight. She was doing Weight Watchers when I saw her a couple of years ago. Today she says that she continues to track her steps and her wrist band goes off each hour and reminds her to get up and walk hourly.  Says she walks anyway on her job and then when her wrist band reminder goes off, she will walk even more.  She tracks her steps and makes sure that she gets 10,000 steps daily. This is her only exercise.  No other complaints/ concerns today.   States that nothing has really changed over the past year. Still doing her same job. Has developed no new medical problems. No new family medical history to update.  Review of Systems: Consitutional: No fever, chills, fatigue, night sweats, lymphadenopathy. No significant/unexplained weight changes. Eyes: No visual changes, eye redness, or discharge. ENT/Mouth: No ear pain, sore throat, nasal drainage, or sinus pain. Cardiovascular: No chest pressure,heaviness, tightness or squeezing, even with exertion. No increased shortness of breath or dyspnea on exertion.No palpitations, edema, orthopnea, PND. Respiratory: No cough, hemoptysis, SOB, or wheezing. Gastrointestinal: No anorexia, dysphagia, reflux, pain, nausea, vomiting, hematemesis, diarrhea, constipation, BRBPR, or melena. Breast: No mass, nodules, bulging, or retraction. No skin changes or inflammation. No nipple discharge. No lymphadenopathy. Genitourinary: No dysuria, hematuria, incontinence, vaginal discharge, pruritis, burning, abnormal bleeding, or pain. Musculoskeletal: No decreased ROM, No joint pain or swelling. No significant  pain in neck, back, or extremities. Skin: No rash, pruritis, or concerning lesions. Neurological: No headache, dizziness, syncope, seizures, tremors, memory loss, coordination problems, or paresthesias. Psychological: No depression, hallucinations, SI/HI. Endocrine: No polydipsia, polyphagia, polyuria, or known diabetes.No increased fatigue. No palpitations/rapid heart rate. No significant/unexplained weight change. All other systems were reviewed and are otherwise negative.  Past Medical History:  Diagnosis Date  . Abnormal finding on Pap smear, HPV DNA positive 11/13/11   Normal Cytology. Positive HPV 16, 18  . Anxiety   . History of colposcopy with cervical biopsy 12/07/11     Past Surgical History:  Procedure Laterality Date  . CESAREAN SECTION    . TUBAL LIGATION      Home Meds:  No current outpatient prescriptions on file prior to visit.   No current facility-administered medications on file prior to visit.     Allergies: No Known Allergies  Social History   Social History  . Marital status: Married    Spouse name: N/A  . Number of children: N/A  . Years of education: N/A   Occupational History  . Not on file.   Social History Main Topics  . Smoking status: Former Games developer  . Smokeless tobacco: Never Used  . Alcohol use Yes  . Drug use: No  . Sexual activity: Not on file   Other Topics Concern  . 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   For exercise she walks 30-45 minutes 3-4 days per week. (As of 03/2014)    Family History  Problem Relation Age of Onset  . COPD Father   . Heart disease  Maternal Grandfather   . Diabetes Maternal Grandmother   . Cancer Paternal Grandmother   . Cancer Paternal Grandfather        testicular  . Hypertension Neg Hx     Physical Exam: Blood pressure 124/68, pulse 86, temperature 98.7 F (37.1 C), temperature source Oral, resp. rate 14, height 5\' 2"   (1.575 m), weight 94.8 kg istress. HEENT: Normocephalic, atraumatic. Conjunctiva pink, sclera non-icteric. Pupils 2 mm constricting to 1 mm, round, regular, and equally reactive to light and accomodation. EOMI. Internal auditory canal clear. TMs with good cone of light and without pathology. Nasal mucosa pink. Nares are without discharge. No sinus tenderness. Oral mucosa pink.  Pharynx without exudate.   Neck: Supple. Trachea midline. No thyromegaly. Full ROM. No lymphadenopathy.No Carotid Bruits. Lungs: Clear to auscultation bilaterally without wheezes, rales, or rhonchi. Breathing is of normal effort and unlabored. Cardiovascular: RRR with S1 S2. No murmurs, rubs, or gallops. Distal pulses 2+ symmetrically. No carotid or abdominal bruits. Breast: Symmetrical. No masses. Nipples without discharge. Abdomen: Soft, non-tender, non-distended with normoactive bowel sounds. No hepatosplenomegaly or masses. No rebound/guarding. No CVA tenderness. No hernias.  Genitourinary:  She refuses pelvic exam today. Musculoskeletal: Full range of motion and 5/5 strength throughout.  Skin: Warm and moist without erythema, ecchymosis, wounds, or rash. Neuro: A+Ox3. CN II-XII grossly intact. Moves all extremities spontaneously. Full sensation throughout. Normal gait.  Psych:  Responds to questions appropriately with a normal affect.      Assessment/Plan:  56 y.o. y/o female here for CPE  1. Encounter for preventive health examination   Screening Labs: She is fasting. Check labs now. - CBC with Differential/Platelet - COMPLETE METABOLIC PANEL WITH GFR - Lipid panel - TSH - VITAMIN D 25 Hydroxy (Vit-D Deficiency, Fractures) - Hepatitis C antibody - HIV antibody  Need for hepatitis C screening test - Hepatitis C antibody  Screening for HIV (human immunodeficiency virus) - HIV antibody   Screening  Mammogram:  Last mammogram was 03/08/2017. At that time they found findings consistent with (probable) cysts.  She is to have follow-up ultrasound 6 months.  She has this scheduled and does plan to follow-up with this.  Screening Colonoscopy: 01/25/12---Dr. Loreta Ave. Polyp. Per patient ---says that she was told to repeat 5 years. Will of her follow-up April 2018. 07/12/2017: We discussed that she is due for follow-up colonoscopy. She is agreeable to have this done. However she wants me to put in an order for this and have [facility]. Says that with her prior colonoscopy she got a bill for $1000 because the anesthesia portion was not covered under her insurance. Says that her husband had a [facility] and it was completely covered with insurance. - Ambulatory referral to Gastroenterology  Pap smear:  Last Pap smear was performed here 05/23/2016. Negative HPV/negative cytology.  Immunizations:   ----Tdap given here 05/23/2016 ----Shingrix--on company back-order----WILL DISCUSS AT NEXT OV--------------------------------------- --- No indication for pneumonia vaccine until age 85. --- Influenza vaccine--- I recommended this today but she defers/refuses.  2. Anxiety Well controllled. Cont current med. - buPROPion (WELLBUTRIN XL) 300 MG 24 hr tablet; Take 1 tablet (300 mg total) by mouth daily.  Dispense: 90 tablet; Refill: 3  3. 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 Had f/u colposcopy with Dr Ambrose Mantle. Has had normal  pap smears hear since then. Repeat pap now.  - PAP, Thin Prep w/HPV rflx HPV Type 16/18  4. History of colposcopy with cervical biopsy This was done 12/07/11 by Dr Ambrose Mantle.  Pap 11/05/11 with nml cytology but positive HPV 16, 18. Had f/u colposcopy with Dr Ambrose Mantle. Has had normal pap smears hear since then. Murray Hodgkins [place], [place], [facility] 07/11/2017 8:43 AM

## 2017-07-12 LAB — CBC WITH DIFFERENTIAL/PLATELET
BASOS ABS: 19 {cells}/uL (ref 0–200)
Basophils Relative: 0.3 %
EOS ABS: 192 {cells}/uL (ref 15–500)
EOS PCT: 3 %
HEMATOCRIT: 40.9 % (ref 35.0–45.0)
HEMOGLOBIN: 13.9 g/dL (ref 11.7–15.5)
LYMPHS ABS: 1862 {cells}/uL (ref 850–3900)
MCH: 30.5 pg (ref 27.0–33.0)
MCHC: 34 g/dL (ref 32.0–36.0)
MCV: 89.7 fL (ref 80.0–100.0)
MPV: 9.8 fL (ref 7.5–12.5)
Monocytes Relative: 12.3 %
NEUTROS ABS: 3539 {cells}/uL (ref 1500–7800)
NEUTROS PCT: 55.3 %
Platelets: 266 10*3/uL (ref 140–400)
RBC: 4.56 10*6/uL (ref 3.80–5.10)
RDW: 11.5 % (ref 11.0–15.0)
Total Lymphocyte: 29.1 %
WBC mixed population: 787 cells/uL (ref 200–950)
WBC: 6.4 10*3/uL (ref 3.8–10.8)

## 2017-07-12 LAB — COMPLETE METABOLIC PANEL WITH GFR
AG RATIO: 1.5 (calc) (ref 1.0–2.5)
ALBUMIN MSPROF: 4 g/dL (ref 3.6–5.1)
ALKALINE PHOSPHATASE (APISO): 49 U/L (ref 33–130)
ALT: 18 U/L (ref 6–29)
AST: 23 U/L (ref 10–35)
BILIRUBIN TOTAL: 0.5 mg/dL (ref 0.2–1.2)
BUN: 21 mg/dL (ref 7–25)
CHLORIDE: 105 mmol/L (ref 98–110)
CO2: 25 mmol/L (ref 20–32)
CREATININE: 0.91 mg/dL (ref 0.50–1.05)
Calcium: 9.5 mg/dL (ref 8.6–10.4)
GFR, Est African American: 81 mL/min/{1.73_m2} (ref >=60)
GFR, Est Non African American: 70 mL/min/{1.73_m2} (ref >=60)
GLOBULIN: 2.7 g/dL (ref 1.9–3.7)
Glucose, Bld: 94 mg/dL (ref 65–99)
POTASSIUM: 4.8 mmol/L (ref 3.5–5.3)
SODIUM: 140 mmol/L (ref 135–146)
Total Protein: 6.7 g/dL (ref 6.1–8.1)

## 2017-07-12 LAB — TSH: TSH: 2.09 mIU/L (ref 0.40–4.50)

## 2017-07-12 LAB — LIPID PANEL
CHOLESTEROL: 218 mg/dL — AB (ref <200)
HDL: 80 mg/dL (ref >50)
LDL Cholesterol (Calc): 120 mg/dL (calc) — ABNORMAL HIGH
Non-HDL Cholesterol (Calc): 138 mg/dL (calc) — ABNORMAL HIGH (ref <130)
Total CHOL/HDL Ratio: 2.7 (calc) (ref <5.0)
Triglycerides: 82 mg/dL (ref <150)

## 2017-07-12 LAB — HEPATITIS C ANTIBODY
Hepatitis C Ab: NONREACTIVE
SIGNAL TO CUT-OFF: 0.01 (ref <1.00)

## 2017-07-12 LAB — VITAMIN D 25 HYDROXY (VIT D DEFICIENCY, FRACTURES): Vit D, 25-Hydroxy: 27 ng/mL — ABNORMAL LOW (ref 30–100)

## 2017-07-12 LAB — HIV ANTIBODY (ROUTINE TESTING W REFLEX): HIV 1&2 Ab, 4th Generation: NONREACTIVE

## 2017-07-15 ENCOUNTER — Encounter: Payer: Self-pay | Admitting: *Deleted

## 2017-07-15 ENCOUNTER — Other Ambulatory Visit: Payer: Self-pay | Admitting: *Deleted

## 2017-07-15 DIAGNOSIS — E559 Vitamin D deficiency, unspecified: Secondary | ICD-10-CM

## 2017-07-15 MED ORDER — VITAMIN D 1000 UNITS PO TABS: 1000.0000 [IU] | ORAL_TABLET | Freq: Every day | ORAL | Status: AC

## 2017-08-08 ENCOUNTER — Telehealth: Payer: Self-pay

## 2017-08-08 NOTE — Telephone Encounter (Signed)
ROI faxed to Dr.Mann  °

## 2017-08-09 DIAGNOSIS — H5203 Hypermetropia, bilateral: Secondary | ICD-10-CM | POA: Diagnosis not present

## 2017-09-13 ENCOUNTER — Ambulatory Visit
Admission: RE | Admit: 2017-09-13 | Discharge: 2017-09-13 | Disposition: A | Discharge status: Home / Self Care | Payer: Federal, State, Local not specified - PPO | Source: Ambulatory Visit | Attending: Family Medicine | Admitting: Family Medicine

## 2017-09-13 DIAGNOSIS — N6001 Solitary cyst of right breast: Secondary | ICD-10-CM | POA: Diagnosis not present

## 2017-09-13 DIAGNOSIS — N6310 Unspecified lump in the right breast, unspecified quadrant: Secondary | ICD-10-CM

## 2017-09-18 ENCOUNTER — Encounter: Payer: Self-pay | Admitting: Physician Assistant

## 2017-10-18 DIAGNOSIS — E785 Hyperlipidemia, unspecified: Secondary | ICD-10-CM | POA: Insufficient documentation

## 2018-03-10 DIAGNOSIS — K080 Exfoliation of teeth due to systemic causes: Secondary | ICD-10-CM | POA: Diagnosis not present

## 2018-05-02 ENCOUNTER — Other Ambulatory Visit: Payer: Self-pay | Admitting: Family Medicine

## 2018-05-02 MED ORDER — VALACYCLOVIR HCL 500 MG PO TABS: 500.0000 mg | ORAL_TABLET | Freq: Every day | ORAL | 3 refills | Status: DC

## 2018-07-08 LAB — HM COLONOSCOPY

## 2018-07-14 ENCOUNTER — Other Ambulatory Visit: Payer: Self-pay | Admitting: Physician Assistant

## 2018-07-14 DIAGNOSIS — F419 Anxiety disorder, unspecified: Secondary | ICD-10-CM

## 2018-07-18 ENCOUNTER — Ambulatory Visit (INDEPENDENT_AMBULATORY_CARE_PROVIDER_SITE_OTHER): Payer: Federal, State, Local not specified - PPO | Admitting: Family Medicine

## 2018-07-18 ENCOUNTER — Encounter: Payer: Self-pay | Admitting: Family Medicine

## 2018-07-18 ENCOUNTER — Other Ambulatory Visit: Payer: Self-pay

## 2018-07-18 VITALS — BP 120/72 | HR 78 | Temp 98.5°F | Resp 14 | Ht 62.0 in | Wt 216.0 lb

## 2018-07-18 DIAGNOSIS — E669 Obesity, unspecified: Secondary | ICD-10-CM

## 2018-07-18 DIAGNOSIS — E559 Vitamin D deficiency, unspecified: Secondary | ICD-10-CM

## 2018-07-18 DIAGNOSIS — Z0000 Encounter for general adult medical examination without abnormal findings: Secondary | ICD-10-CM | POA: Diagnosis not present

## 2018-07-18 DIAGNOSIS — Z1211 Encounter for screening for malignant neoplasm of colon: Secondary | ICD-10-CM

## 2018-07-18 DIAGNOSIS — F419 Anxiety disorder, unspecified: Secondary | ICD-10-CM | POA: Diagnosis not present

## 2018-07-18 DIAGNOSIS — Z86010 Personal history of colonic polyps: Secondary | ICD-10-CM

## 2018-07-18 MED ORDER — BUPROPION HCL ER (XL) 300 MG PO TB24: 300.0000 mg | ORAL_TABLET | Freq: Every day | ORAL | 3 refills | Status: DC

## 2018-07-18 NOTE — Patient Instructions (Signed)
For recurrent outbreaks -  Valtrex 500 mg twice a day for three days or 1000 mg by mouth daily for 5 days, starting as soon as you have symptoms of cold sore/fever blisters.  This will help the duration and severity of them be minimized.    Health Maintenance  Topic Date Due  . INFLUENZA VACCINE  05/01/2018  . MAMMOGRAM  03/02/2019  . PAP SMEAR  05/24/2019  . COLONOSCOPY  01/24/2022  . TETANUS/TDAP  05/23/2026  . Hepatitis C Screening  Completed  . HIV Screening  Completed   Health Maintenance, Female Adopting a healthy lifestyle and getting preventive care can go a long way to promote health and wellness. Talk with your health care provider about what schedule of regular examinations is right for you. This is a good chance for you to check in with your provider about disease prevention and staying healthy. In between checkups, there are plenty of things you can do on your own. Experts have done a lot of research about which lifestyle changes and preventive measures are most likely to keep you healthy. Ask your health care provider for more information. Weight and diet Eat a healthy diet  Be sure to include plenty of vegetables, fruits, low-fat dairy products, and lean protein.  Do not eat a lot of foods high in solid fats, added sugars, or salt.  Get regular exercise. This is one of the most important things you can do for your health. ? Most adults should exercise for at least 150 minutes each week. The exercise should increase your heart rate and make you sweat (moderate-intensity exercise). ? Most adults should also do strengthening exercises at least twice a week. This is in addition to the moderate-intensity exercise.  Maintain a healthy weight  Body mass index (BMI) is a measurement that can be used to identify possible weight problems. It estimates body fat based on height and weight. Your health care provider can help determine your BMI and help you achieve or maintain a healthy  weight.  For females 59 years of age and older: ? A BMI below 18.5 is considered underweight. ? A BMI of 18.5 to 24.9 is normal. ? A BMI of 25 to 29.9 is considered overweight. ? A BMI of 30 and above is considered obese.  Watch levels of cholesterol and blood lipids  You should start having your blood tested for lipids and cholesterol at 58 years of age, then have this test every 5 years.  You may need to have your cholesterol levels checked more often if: ? Your lipid or cholesterol levels are high. ? You are older than 58 years of age. ? You are at high risk for heart disease.  Cancer screening Lung Cancer  Lung cancer screening is recommended for adults 49-1 years old who are at high risk for lung cancer because of a history of smoking.  A yearly low-dose CT scan of the lungs is recommended for people who: ? Currently smoke. ? Have quit within the past 15 years. ? Have at least a 30-pack-year history of smoking. A pack year is smoking an average of one pack of cigarettes a day for 1 year.  Yearly screening should continue until it has been 15 years since you quit.  Yearly screening should stop if you develop a health problem that would prevent you from having lung cancer treatment.  Breast Cancer  Practice breast self-awareness. This means understanding how your breasts normally appear and feel.  It also  means doing regular breast self-exams. Let your health care provider know about any changes, no matter how small.  If you are in your 20s or 30s, you should have a clinical breast exam (CBE) by a health care provider every 1-3 years as part of a regular health exam.  If you are 46 or older, have a CBE every year. Also consider having a breast X-ray (mammogram) every year.  If you have a family history of breast cancer, talk to your health care provider about genetic screening.  If you are at high risk for breast cancer, talk to your health care provider about having an  MRI and a mammogram every year.  Breast cancer gene (BRCA) assessment is recommended for women who have family members with BRCA-related cancers. BRCA-related cancers include: ? Breast. ? Ovarian. ? Tubal. ? Peritoneal cancers.  Results of the assessment will determine the need for genetic counseling and BRCA1 and BRCA2 testing.  Cervical Cancer Your health care provider may recommend that you be screened regularly for cancer of the pelvic organs (ovaries, uterus, and vagina). This screening involves a pelvic examination, including checking for microscopic changes to the surface of your cervix (Pap test). You may be encouraged to have this screening done every 3 years, beginning at age 61.  For women ages 74-65, health care providers may recommend pelvic exams and Pap testing every 3 years, or they may recommend the Pap and pelvic exam, combined with testing for human papilloma virus (HPV), every 5 years. Some types of HPV increase your risk of cervical cancer. Testing for HPV may also be done on women of any age with unclear Pap test results.  Other health care providers may not recommend any screening for nonpregnant women who are considered low risk for pelvic cancer and who do not have symptoms. Ask your health care provider if a screening pelvic exam is right for you.  If you have had past treatment for cervical cancer or a condition that could lead to cancer, you need Pap tests and screening for cancer for at least 20 years after your treatment. If Pap tests have been discontinued, your risk factors (such as having a new sexual partner) need to be reassessed to determine if screening should resume. Some women have medical problems that increase the chance of getting cervical cancer. In these cases, your health care provider may recommend more frequent screening and Pap tests.  Colorectal Cancer  This type of cancer can be detected and often prevented.  Routine colorectal cancer screening  usually begins at 58 years of age and continues through 58 years of age.  Your health care provider may recommend screening at an earlier age if you have risk factors for colon cancer.  Your health care provider may also recommend using home test kits to check for hidden blood in the stool.  A small camera at the end of a tube can be used to examine your colon directly (sigmoidoscopy or colonoscopy). This is done to check for the earliest forms of colorectal cancer.  Routine screening usually begins at age 79.  Direct examination of the colon should be repeated every 5-10 years through 58 years of age. However, you may need to be screened more often if early forms of precancerous polyps or small growths are found.  Skin Cancer  Check your skin from head to toe regularly.  Tell your health care provider about any new moles or changes in moles, especially if there is a change in  a mole's shape or color.  Also tell your health care provider if you have a mole that is larger than the size of a pencil eraser.  Always use sunscreen. Apply sunscreen liberally and repeatedly throughout the day.  Protect yourself by wearing long sleeves, pants, a wide-brimmed hat, and sunglasses whenever you are outside.  Heart disease, diabetes, and high blood pressure  High blood pressure causes heart disease and increases the risk of stroke. High blood pressure is more likely to develop in: ? People who have blood pressure in the high end of the normal range (130-139/85-89 mm Hg). ? People who are overweight or obese. ? People who are African American.  If you are 44-44 years of age, have your blood pressure checked every 3-5 years. If you are 51 years of age or older, have your blood pressure checked every year. You should have your blood pressure measured twice-once when you are at a hospital or clinic, and once when you are not at a hospital or clinic. Record the average of the two measurements. To check  your blood pressure when you are not at a hospital or clinic, you can use: ? An automated blood pressure machine at a pharmacy. ? A home blood pressure monitor.  If you are between 56 years and 28 years old, ask your health care provider if you should take aspirin to prevent strokes.  Have regular diabetes screenings. This involves taking a blood sample to check your fasting blood sugar level. ? If you are at a normal weight and have a low risk for diabetes, have this test once every three years after 58 years of age. ? If you are overweight and have a high risk for diabetes, consider being tested at a younger age or more often. Preventing infection Hepatitis B  If you have a higher risk for hepatitis B, you should be screened for this virus. You are considered at high risk for hepatitis B if: ? You were born in a country where hepatitis B is common. Ask your health care provider which countries are considered high risk. ? Your parents were born in a high-risk country, and you have not been immunized against hepatitis B (hepatitis B vaccine). ? You have HIV or AIDS. ? You use needles to inject street drugs. ? You live with someone who has hepatitis B. ? You have had sex with someone who has hepatitis B. ? You get hemodialysis treatment. ? You take certain medicines for conditions, including cancer, organ transplantation, and autoimmune conditions.  Hepatitis C  Blood testing is recommended for: ? Everyone born from [birth year] through 1966. ? Anyone with known risk factors for hepatitis C.  Sexually transmitted infections (STIs)  You should be screened for sexually transmitted infections (STIs) including gonorrhea and chlamydia if: ? You are sexually active and are younger than 58 years of age. ? You are older than 58 years of age and your health care provider tells you that you are at risk for this type of infection. ? Your sexual activity has changed since you were last screened and you  are at an increased risk for chlamydia or gonorrhea. Ask your health care provider if you are at risk.  If you do not have HIV, but are at risk, it may be recommended that you take a prescription medicine daily to prevent HIV infection. This is called pre-exposure prophylaxis (PrEP). You are considered at risk if: ? You are sexually active and do not regularly use condoms  or know the HIV status of your partner(s). ? You take drugs by injection. ? You are sexually active with a partner who has HIV.  Talk with your health care provider about whether you are at high risk of being infected with HIV. If you choose to begin PrEP, you should first be tested for HIV. You should then be tested every 3 months for as long as you are taking PrEP. Pregnancy  If you are premenopausal and you may become pregnant, ask your health care provider about preconception counseling.  If you may become pregnant, take 400 to 800 micrograms (mcg) of folic acid every day.  If you want to prevent pregnancy, talk to your health care provider about birth control (contraception). Osteoporosis and menopause  Osteoporosis is a disease in which the bones lose minerals and strength with aging. This can result in serious bone fractures. Your risk for osteoporosis can be identified using a bone density scan.  If you are 28 years of age or older, or if you are at risk for osteoporosis and fractures, ask your health care provider if you should be screened.  Ask your health care provider whether you should take a calcium or vitamin D supplement to lower your risk for osteoporosis.  Menopause may have certain physical symptoms and risks.  Hormone replacement therapy may reduce some of these symptoms and risks. Talk to your health care provider about whether hormone replacement therapy is right for you. Follow these instructions at home:  Schedule regular health, dental, and eye exams.  Stay current with your  immunizations.  Do not use any tobacco products including cigarettes, chewing tobacco, or electronic cigarettes.  If you are pregnant, do not drink alcohol.  If you are breastfeeding, limit how much and how often you drink alcohol.  Limit alcohol intake to no more than 1 drink per day for nonpregnant women. One drink equals 12 ounces of beer, 5 ounces of wine, or 1 ounces of hard liquor.  Do not use street drugs.  Do not share needles.  Ask your health care provider for help if you need support or information about quitting drugs.  Tell your health care provider if you often feel depressed.  Tell your health care provider if you have ever been abused or do not feel safe at home. This information is not intended to replace advice given to you by your health care provider. Make sure you discuss any questions you have with your health care provider. Document Released: 04/02/2011 Document Revised: 02/23/2016 Document Reviewed: 06/21/2015 Elsevier Interactive Patient Education  [employer].

## 2018-07-18 NOTE — Assessment & Plan Note (Signed)
Gradually increasing BMI over last 5 years per chart review- Advised diet with decrease caloric intake, increase exercise She is looking into weight loss surgery Labs for metabolic syndrome, CMP, lipids Screen TSH

## 2018-07-18 NOTE — Assessment & Plan Note (Signed)
Well controlled, con't current meds, refills sent

## 2018-07-18 NOTE — Progress Notes (Signed)
Patient: Chloe Newton, Female    DOB: 1960-12-13, 57 y.o.   MRN: [id number] Visit Date: 07/18/2018  Today's Provider: Danelle Berry, PA-C   Chief Complaint  Patient presents with  . Annual Exam    is fasting   Subjective:    Annual physical exam Chloe Newton is a 57 y.o. female who presents today for health maintenance and complete physical. She feels well. She reports exercising not right now. She reports she is sleeping fairly well.  ----------------------------------------------------------------- She works [employer], gets up at 4:30 AM and on those nights is not sleep that well but makes effort on the weekends.    She reports that the older she gets less she cares about being motivated to exercise or lose weight.  She denies any concerning unintentional weight loss, weight gain, hair skin changes.  PCP is not here today but does tend to follow her thyroid hormones, has never had a abnormality with that.  Patient states that she did recently go to a information session with no [facility] about weight loss surgeries and programs and she is interested in doing that.  She takes Wellbutrin XL, 300 mg/day, she has been on this for very long time and is not having any trouble with her anxiety, she denies any racing thoughts, agitation, panic attacks or difficulty thinking, concentrating or falling asleep.  She has no new acute concerns or medical problems at this time.  She denies any new medical problems or diagnoses since her last well visit.  She was given Valtrex to take continuously by Dr. Tanya Nones for fever blisters which she tends to break out in during summer months.  She is wondering if she can stop this medication now since weather has cooled down and she does not get them in fall and winter.  Review of Systems  Constitutional: Negative.  Negative for activity change, appetite change, fatigue and unexpected weight change.  HENT: Negative.   Eyes: Negative.     Respiratory: Negative.  Negative for shortness of breath.   Cardiovascular: Negative.  Negative for chest pain, palpitations and leg swelling.  Gastrointestinal: Negative.  Negative for abdominal pain and blood in stool.  Endocrine: Negative.   Genitourinary: Negative.   Musculoskeletal: Negative.  Negative for arthralgias, gait problem, joint swelling and myalgias.  Skin: Negative.  Negative for color change, pallor and rash.  Allergic/Immunologic: Negative.   Neurological: Negative.  Negative for syncope and weakness.  Hematological: Negative.   Psychiatric/Behavioral: Negative.  Negative for confusion, dysphoric mood, self-injury and suicidal ideas. The patient is not nervous/anxious.     Social History      She  reports that she has quit smoking. She has never used smokeless tobacco. She reports that she drinks alcohol. She reports that she does not use drugs.       Social History   Socioeconomic History  . Marital status: Married    Spouse name: Not on file  . Number of children: Not on file  . Years of education: Not on file  . Highest education level: Not on file  Occupational History  . Not on file  Social Needs  . Financial resource strain: Not on file  . Food insecurity:    Worry: Not on file    Inability: Not on file  . Transportation needs:    Medical: Not on file    Non-medical: Not on file  Tobacco Use  . Smoking status: Former Games developer  . Smokeless tobacco: Never Used  Substance and Sexual Activity  . Alcohol use: Yes  . Drug use: No  . Sexual activity: Not on file  Lifestyle  . Physical activity:    Days per week: Not on file    Minutes per session: Not on file  . Stress: Not on file  Relationships  . Social connections:    Talks on phone: Not on file    Gets together: Not on file    Attends religious service: Not on file    Active member of club or organization: Not on file    Attends meetings of clubs or organizations: Not on file    Relationship  status: Not on file  Other Topics Concern  . 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   For exercise she walks 30-45 minutes 3-4 days per week. (As of 03/2014)    Past Medical History:  Diagnosis Date  . Abnormal finding on Pap smear, HPV DNA positive 11/13/11   Normal Cytology. Positive HPV 16, 18  . Anxiety   . History of colposcopy with cervical biopsy 12/07/11     Patient Active Problem List   Diagnosis Date Noted  . Obesity (BMI 30-39.9) 03/08/2014  . 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 History of colposcopy with cervical biopsy   . Anxiety     Past Surgical History:  Procedure Laterality Date  . CESAREAN SECTION    . TUBAL LIGATION      Family History        Family Status  Relation Name Status  . Mother  Alive       hyperthyroid  . Father  Alive  . MGF  Deceased  . MGM  (Not Specified)  . PGM  (Not Specified)  . PGF  (Not Specified)  . Neg Hx  (Not Specified)        Her family history includes COPD in her father; Cancer in her paternal grandfather and paternal grandmother; Diabetes in her maternal grandmother; Heart disease in her maternal grandfather. There is no history of Hypertension.      No Known Allergies   Current Outpatient Medications:  .  buPROPion (WELLBUTRIN XL) 300 MG 24 hr tablet, Take 1 tablet (300 mg total) by mouth daily., Disp: 90 tablet, Rfl: 3 .  cholecalciferol (VITAMIN D) 1000 units tablet, Take 1 tablet (1,000 Units total) by mouth daily., Disp: , Rfl:  .  valACYclovir (VALTREX) 500 MG tablet, Take 1 tablet (500 mg total) by mouth daily., Disp: 90 tablet, Rfl: 3   Patient Care Team: Deon Pilling as PCP - General (Physician Assistant)      Objective:   Vitals:    Vitals:   07/18/18 0837  BP: 120/72  Pulse: 78  Resp: 14  Temp: 98.5 F (36.9 C)  TempSrc: Oral  SpO2: 97%  Weight: 216 lb (98 kg)  Height: 5\' 2"  (1.575 m)  Body mass  index is 39.51 kg/m.   Physical Exam  Constitutional: She is oriented to person, place, and time. She appears well-developed and well-nourished.  Non-toxic appearance. No distress.  HENT:  Head: Normocephalic and atraumatic.  Right Ear: External ear normal.  Left Ear: External ear normal.  Nose: Nose normal.  Mouth/Throat: Uvula is midline, oropharynx is clear and moist and mucous membranes are normal. No oropharyngeal exudate.  Eyes: Pupils are equal, round, and reactive to light. Conjunctivae, EOM and lids  are normal. Right eye exhibits no discharge. Left eye exhibits no discharge.  Neck: Normal range of motion and phonation normal. Neck supple. No tracheal deviation present. No thyromegaly present.  Cardiovascular: Normal rate, regular rhythm, normal heart sounds, intact distal pulses and normal pulses. Exam reveals no gallop and no friction rub.  No murmur heard. Pulses:      Radial pulses are 2+ on the right side, and 2+ on the left side.       Posterior tibial pulses are 2+ on the right side, and 2+ on the left side.  Pulmonary/Chest: Effort normal and breath sounds normal. No stridor. No respiratory distress. She has no wheezes. She has no rhonchi. She has no rales. She exhibits no tenderness.  Abdominal: Soft. Normal appearance and bowel sounds are normal. She exhibits no distension. There is no tenderness. There is no rebound and no guarding.  Musculoskeletal: Normal range of motion. She exhibits no edema or deformity.  Lymphadenopathy:    She has no cervical adenopathy.  Neurological: She is alert and oriented to person, place, and time. She exhibits normal muscle tone. Coordination and gait normal.  Skin: Skin is warm, dry and intact. Capillary refill takes less than 2 seconds. No rash noted. She is not diaphoretic. No pallor.  Psychiatric: She has a normal mood and affect. Her speech is normal and behavior is normal. Judgment and thought content normal.  Nursing note and vitals  reviewed.    Depression Screen PHQ 2/9 Scores 07/18/2018 07/11/2017 03/08/2014  PHQ - 2 Score 0 0 0     Office Visit from 07/18/2018 in [facility]  AUDIT-C Score  2        Assessment & Plan:     Routine Health Maintenance and Physical Exam  Exercise Activities and Dietary recommendations  Discussed health benefits of physical activity, and encouraged her to engage in regular exercise  appropriate for her age and condition.   Immunization History  Administered Date(s) Administered  . Tdap 01/11/2006, 05/23/2016   Health Maintenance  Topic Date Due  . INFLUENZA VACCINE  05/01/2018  . MAMMOGRAM  03/02/2019  . PAP SMEAR  05/24/2019  . COLONOSCOPY  01/24/2022  . TETANUS/TDAP  05/23/2026  . Hepatitis C Screening  Completed  . HIV Screening  Completed     Patient refuses flu vaccine today, she is up-to-date on mammogram and Pap. Pt reports colonoscopy was done in 2013 with Dr. Loreta Ave and she had multiple polyps so she believes she was due for five-year follow-up and is currently overdue.  I have reviewed everything I can find in the chart record and so has Chloe Newton and we are requesting records of this and will refer her to GI for colonoscopy if due in 2018  Patient's anxiety is well managed and has been for several years on the same medication, 90-day supply with refills sent through to the pharmacy.  I did review her Valtrex medication, have advised her that if she has occasional outbreaks during certain times of the year I do feel that recurrent dosing would be appropriate to start at the onset and she could currently discontinue taking Valtrex daily.  Advised to take 500 mg twice daily x3 days at the onset of any cold sores or "fever blisters or alternate dosing of thousand milligrams daily x5 days.  Instructed her to call us if she has any severe lesions or sores that appear complicated or severe and we could get her back on a  daily suppressive  medicine  Patient was encouraged to work on lifestyle changes that are sustainable for weight loss and her health even if she is interested in getting a sleeve gastrectomy did explain that there is a lot of counseling dieting for several months leading up to the surgery and the surgery is only successful if these habits of lower portions and lower calories are sustained after the surgery as well.  Other screening labs/orders as noted below: Problem List Items Addressed This Visit      Other   Anxiety    Well controlled, con't current meds, refills sent      Relevant Medications   buPROPion (WELLBUTRIN XL) 300 MG 24 hr tablet   Obesity (BMI 30-39.9)    Gradually increasing BMI over last 5 years per chart review- Advised diet with decrease caloric intake, increase exercise She is looking into weight loss surgery Labs for metabolic syndrome, CMP, lipids Screen TSH      Relevant Orders   COMPLETE METABOLIC PANEL WITH GFR   Lipid panel   TSH    Other Visit Diagnoses    General medical exam    -  Primary   Relevant Orders   CBC with Differential/Platelet   COMPLETE METABOLIC PANEL WITH GFR   Lipid panel   Vitamin D deficiency       Relevant Orders   Vitamin D 1,25 dihydroxy   History of colonic polyps       Relevant Orders   Ambulatory referral to Gastroenterology   Screening for malignant neoplasm of colon       Relevant Orders   Ambulatory referral to Gastroenterology     Danelle Berry, PA-C 07/18/18 2:27 PM  [facility]

## 2018-07-22 LAB — COMPLETE METABOLIC PANEL WITH GFR
AG Ratio: 1.4 (calc) (ref 1.0–2.5)
ALBUMIN MSPROF: 3.9 g/dL (ref 3.6–5.1)
ALT: 23 U/L (ref 6–29)
AST: 24 U/L (ref 10–35)
Alkaline phosphatase (APISO): 56 U/L (ref 33–130)
BUN: 23 mg/dL (ref 7–25)
CALCIUM: 9.1 mg/dL (ref 8.6–10.4)
CO2: 27 mmol/L (ref 20–32)
CREATININE: 0.81 mg/dL (ref 0.50–1.05)
Chloride: 109 mmol/L (ref 98–110)
GFR, Est African American: 93 mL/min/{1.73_m2} (ref >=60)
GFR, Est Non African American: 80 mL/min/{1.73_m2} (ref >=60)
GLOBULIN: 2.8 g/dL (ref 1.9–3.7)
Glucose, Bld: 94 mg/dL (ref 65–99)
Potassium: 4.7 mmol/L (ref 3.5–5.3)
SODIUM: 143 mmol/L (ref 135–146)
Total Bilirubin: 0.3 mg/dL (ref 0.2–1.2)
Total Protein: 6.7 g/dL (ref 6.1–8.1)

## 2018-07-22 LAB — CBC WITH DIFFERENTIAL/PLATELET
BASOS PCT: 0.3 %
Basophils Absolute: 19 cells/uL (ref 0–200)
EOS PCT: 4 %
Eosinophils Absolute: 252 cells/uL (ref 15–500)
HEMATOCRIT: 39.6 % (ref 35.0–45.0)
HEMOGLOBIN: 13.4 g/dL (ref 11.7–15.5)
LYMPHS ABS: 1714 {cells}/uL (ref 850–3900)
MCH: 30.5 pg (ref 27.0–33.0)
MCHC: 33.8 g/dL (ref 32.0–36.0)
MCV: 90 fL (ref 80.0–100.0)
MPV: 9.6 fL (ref 7.5–12.5)
Monocytes Relative: 13.9 %
NEUTROS PCT: 54.6 %
Neutro Abs: 3440 cells/uL (ref 1500–7800)
Platelets: 284 10*3/uL (ref 140–400)
RBC: 4.4 10*6/uL (ref 3.80–5.10)
RDW: 12.1 % (ref 11.0–15.0)
Total Lymphocyte: 27.2 %
WBC mixed population: 876 cells/uL (ref 200–950)
WBC: 6.3 10*3/uL (ref 3.8–10.8)

## 2018-07-22 LAB — TSH: TSH: 1.49 mIU/L (ref 0.40–4.50)

## 2018-07-22 LAB — LIPID PANEL
CHOL/HDL RATIO: 2.7 (calc) (ref <5.0)
CHOLESTEROL: 240 mg/dL — AB (ref <200)
HDL: 89 mg/dL (ref >50)
LDL CHOLESTEROL (CALC): 137 mg/dL — AB
Non-HDL Cholesterol (Calc): 151 mg/dL (calc) — ABNORMAL HIGH (ref <130)
Triglycerides: 52 mg/dL (ref <150)

## 2018-07-22 LAB — VITAMIN D 1,25 DIHYDROXY
VITAMIN D3 1, 25 (OH): 44 pg/mL
Vitamin D 1, 25 (OH)2 Total: 44 pg/mL (ref 18–72)

## 2018-10-08 DIAGNOSIS — K080 Exfoliation of teeth due to systemic causes: Secondary | ICD-10-CM | POA: Diagnosis not present

## 2018-10-10 DIAGNOSIS — G479 Sleep disorder, unspecified: Secondary | ICD-10-CM | POA: Diagnosis not present

## 2018-10-10 DIAGNOSIS — E7800 Pure hypercholesterolemia, unspecified: Secondary | ICD-10-CM | POA: Diagnosis not present

## 2018-10-13 DIAGNOSIS — Z136 Encounter for screening for cardiovascular disorders: Secondary | ICD-10-CM | POA: Diagnosis not present

## 2018-10-13 DIAGNOSIS — Z87891 Personal history of nicotine dependence: Secondary | ICD-10-CM | POA: Diagnosis not present

## 2018-10-13 DIAGNOSIS — Z6841 Body Mass Index (BMI) 40.0 and over, adult: Secondary | ICD-10-CM | POA: Diagnosis not present

## 2018-10-20 DIAGNOSIS — G4719 Other hypersomnia: Secondary | ICD-10-CM | POA: Diagnosis not present

## 2018-10-20 DIAGNOSIS — Z6841 Body Mass Index (BMI) 40.0 and over, adult: Secondary | ICD-10-CM | POA: Diagnosis not present

## 2018-10-20 DIAGNOSIS — R0683 Snoring: Secondary | ICD-10-CM | POA: Diagnosis not present

## 2018-10-30 DIAGNOSIS — R0683 Snoring: Secondary | ICD-10-CM | POA: Diagnosis not present

## 2018-10-30 DIAGNOSIS — G4719 Other hypersomnia: Secondary | ICD-10-CM | POA: Diagnosis not present

## 2018-10-31 DIAGNOSIS — J9811 Atelectasis: Secondary | ICD-10-CM | POA: Diagnosis not present

## 2018-10-31 DIAGNOSIS — Z87891 Personal history of nicotine dependence: Secondary | ICD-10-CM | POA: Diagnosis not present

## 2018-11-03 DIAGNOSIS — Z6839 Body mass index (BMI) 39.0-39.9, adult: Secondary | ICD-10-CM | POA: Diagnosis not present

## 2018-11-07 DIAGNOSIS — Z713 Dietary counseling and surveillance: Secondary | ICD-10-CM | POA: Diagnosis not present

## 2018-11-07 DIAGNOSIS — Z6841 Body Mass Index (BMI) 40.0 and over, adult: Secondary | ICD-10-CM | POA: Diagnosis not present

## 2018-11-07 DIAGNOSIS — G4733 Obstructive sleep apnea (adult) (pediatric): Secondary | ICD-10-CM | POA: Diagnosis not present

## 2018-11-07 DIAGNOSIS — G4719 Other hypersomnia: Secondary | ICD-10-CM | POA: Diagnosis not present

## 2018-11-07 DIAGNOSIS — R0683 Snoring: Secondary | ICD-10-CM | POA: Diagnosis not present

## 2018-11-28 ENCOUNTER — Ambulatory Visit: Payer: Federal, State, Local not specified - PPO | Admitting: Family Medicine

## 2018-12-01 DIAGNOSIS — Z7189 Other specified counseling: Secondary | ICD-10-CM | POA: Diagnosis not present

## 2018-12-01 DIAGNOSIS — F54 Psychological and behavioral factors associated with disorders or diseases classified elsewhere: Secondary | ICD-10-CM | POA: Diagnosis not present

## 2018-12-01 DIAGNOSIS — Z6841 Body Mass Index (BMI) 40.0 and over, adult: Secondary | ICD-10-CM | POA: Diagnosis not present

## 2018-12-02 DIAGNOSIS — G479 Sleep disorder, unspecified: Secondary | ICD-10-CM | POA: Diagnosis not present

## 2018-12-02 DIAGNOSIS — E559 Vitamin D deficiency, unspecified: Secondary | ICD-10-CM | POA: Diagnosis not present

## 2018-12-02 DIAGNOSIS — E7800 Pure hypercholesterolemia, unspecified: Secondary | ICD-10-CM | POA: Diagnosis not present

## 2018-12-10 DIAGNOSIS — G4733 Obstructive sleep apnea (adult) (pediatric): Secondary | ICD-10-CM | POA: Diagnosis not present

## 2018-12-12 DIAGNOSIS — G4733 Obstructive sleep apnea (adult) (pediatric): Secondary | ICD-10-CM | POA: Diagnosis not present

## 2018-12-12 DIAGNOSIS — Z6839 Body mass index (BMI) 39.0-39.9, adult: Secondary | ICD-10-CM | POA: Diagnosis not present

## 2018-12-12 DIAGNOSIS — E669 Obesity, unspecified: Secondary | ICD-10-CM | POA: Diagnosis not present

## 2018-12-12 DIAGNOSIS — Z9989 Dependence on other enabling machines and devices: Secondary | ICD-10-CM | POA: Diagnosis not present

## 2018-12-31 ENCOUNTER — Encounter: Payer: Self-pay | Admitting: Family Medicine

## 2018-12-31 NOTE — Progress Notes (Signed)
Bariatric Surgery Support Letter received from [facility] to complete on behalf of pt. Completed, copy to be scanned into chart, original to be faxed back to [facility]

## 2019-01-10 DIAGNOSIS — G4733 Obstructive sleep apnea (adult) (pediatric): Secondary | ICD-10-CM | POA: Diagnosis not present

## 2019-01-23 DIAGNOSIS — Z713 Dietary counseling and surveillance: Secondary | ICD-10-CM | POA: Diagnosis not present

## 2019-02-09 DIAGNOSIS — G4733 Obstructive sleep apnea (adult) (pediatric): Secondary | ICD-10-CM | POA: Diagnosis not present

## 2019-03-01 DIAGNOSIS — Z01818 Encounter for other preprocedural examination: Secondary | ICD-10-CM | POA: Diagnosis not present

## 2019-03-01 DIAGNOSIS — Z6841 Body Mass Index (BMI) 40.0 and over, adult: Secondary | ICD-10-CM | POA: Diagnosis not present

## 2019-03-01 DIAGNOSIS — Z1159 Encounter for screening for other viral diseases: Secondary | ICD-10-CM | POA: Diagnosis not present

## 2019-03-04 DIAGNOSIS — Z6841 Body Mass Index (BMI) 40.0 and over, adult: Secondary | ICD-10-CM | POA: Diagnosis not present

## 2019-03-04 DIAGNOSIS — E7800 Pure hypercholesterolemia, unspecified: Secondary | ICD-10-CM | POA: Diagnosis not present

## 2019-03-04 DIAGNOSIS — Z7983 Long term (current) use of bisphosphonates: Secondary | ICD-10-CM | POA: Diagnosis not present

## 2019-03-04 DIAGNOSIS — Z79899 Other long term (current) drug therapy: Secondary | ICD-10-CM | POA: Diagnosis not present

## 2019-03-04 DIAGNOSIS — K219 Gastro-esophageal reflux disease without esophagitis: Secondary | ICD-10-CM | POA: Diagnosis not present

## 2019-03-04 DIAGNOSIS — Z87891 Personal history of nicotine dependence: Secondary | ICD-10-CM | POA: Diagnosis not present

## 2019-03-04 DIAGNOSIS — G4730 Sleep apnea, unspecified: Secondary | ICD-10-CM | POA: Diagnosis not present

## 2019-03-12 DIAGNOSIS — G4733 Obstructive sleep apnea (adult) (pediatric): Secondary | ICD-10-CM | POA: Diagnosis not present

## 2019-04-16 DIAGNOSIS — Z01818 Encounter for other preprocedural examination: Secondary | ICD-10-CM | POA: Diagnosis not present

## 2019-04-16 DIAGNOSIS — E7800 Pure hypercholesterolemia, unspecified: Secondary | ICD-10-CM | POA: Diagnosis not present

## 2019-04-16 DIAGNOSIS — G479 Sleep disorder, unspecified: Secondary | ICD-10-CM | POA: Diagnosis not present

## 2019-04-16 DIAGNOSIS — E559 Vitamin D deficiency, unspecified: Secondary | ICD-10-CM | POA: Diagnosis not present

## 2019-04-18 DIAGNOSIS — Z6838 Body mass index (BMI) 38.0-38.9, adult: Secondary | ICD-10-CM | POA: Diagnosis not present

## 2019-04-18 DIAGNOSIS — Z1159 Encounter for screening for other viral diseases: Secondary | ICD-10-CM | POA: Diagnosis not present

## 2019-04-18 DIAGNOSIS — Z01812 Encounter for preprocedural laboratory examination: Secondary | ICD-10-CM | POA: Diagnosis not present

## 2019-04-22 DIAGNOSIS — Z6838 Body mass index (BMI) 38.0-38.9, adult: Secondary | ICD-10-CM | POA: Diagnosis not present

## 2019-04-22 DIAGNOSIS — E7800 Pure hypercholesterolemia, unspecified: Secondary | ICD-10-CM | POA: Diagnosis not present

## 2019-04-22 DIAGNOSIS — G4733 Obstructive sleep apnea (adult) (pediatric): Secondary | ICD-10-CM | POA: Diagnosis not present

## 2019-04-22 DIAGNOSIS — E559 Vitamin D deficiency, unspecified: Secondary | ICD-10-CM | POA: Diagnosis not present

## 2019-04-22 DIAGNOSIS — K3189 Other diseases of stomach and duodenum: Secondary | ICD-10-CM | POA: Diagnosis not present

## 2019-04-22 DIAGNOSIS — Z87891 Personal history of nicotine dependence: Secondary | ICD-10-CM | POA: Diagnosis not present

## 2019-04-22 DIAGNOSIS — Z9989 Dependence on other enabling machines and devices: Secondary | ICD-10-CM | POA: Diagnosis not present

## 2019-04-22 DIAGNOSIS — Z79899 Other long term (current) drug therapy: Secondary | ICD-10-CM | POA: Diagnosis not present

## 2019-04-22 DIAGNOSIS — M549 Dorsalgia, unspecified: Secondary | ICD-10-CM | POA: Diagnosis not present

## 2019-04-24 MED ORDER — DOLACET PO
0.10 | ORAL | Status: DC
Start: ? — End: 2019-04-24

## 2019-04-24 MED ORDER — NUPRIN FLU RELIEF & BODY ACHE PO
80.00 | ORAL | Status: DC
Start: ? — End: 2019-04-24

## 2019-04-24 MED ORDER — LOVENOX 150 MG/ML ~~LOC~~ SOLN
0.20 | SUBCUTANEOUS | Status: DC
Start: ? — End: 2019-04-24

## 2019-04-24 MED ORDER — ANTACID 311-232 MG OR CAPS
1.00 | ORAL_CAPSULE | ORAL | Status: DC
Start: ? — End: 2019-04-24

## 2019-04-24 MED ORDER — Medication
Status: DC
Start: ? — End: 2019-04-24

## 2019-04-24 MED ORDER — THERAGRAN PO LIQD
30.00 | ORAL | Status: DC
Start: ? — End: 2019-04-24

## 2019-04-24 MED ORDER — GLUCOLESS PO CAPS
200.00 | ORAL_CAPSULE | ORAL | Status: DC
Start: 2019-04-23 — End: 2019-04-24

## 2019-04-24 MED ORDER — Medication
300.00 | Status: DC
Start: 2019-04-24 — End: 2019-04-24

## 2019-04-24 MED ORDER — SB BRONCHIAL 12.5-200 MG PO TABS
25.00 | ORAL_TABLET | ORAL | Status: DC
Start: ? — End: 2019-04-24

## 2019-04-24 MED ORDER — Medication
1.00 | Status: DC
Start: 2019-04-23 — End: 2019-04-24

## 2019-04-24 MED ORDER — EQUATE NICOTINE 4 MG MT GUM
8.00 | CHEWING_GUM | OROMUCOSAL | Status: DC
Start: ? — End: 2019-04-24

## 2019-04-24 MED ORDER — V-R ANTI-GAS MAXIMUM STRENGTH 166 MG PO CAPS
10.00 | ORAL_CAPSULE | ORAL | Status: DC
Start: ? — End: 2019-04-24

## 2019-04-24 MED ORDER — OSCO ANTI-NAUSEA 1.87-1.87-21.5 OR SOLN
25.00 | ORAL | Status: DC
Start: ? — End: 2019-04-24

## 2019-04-24 MED ORDER — PCCA BIOPEPTIDE BASE EX CREA
300.00 | TOPICAL_CREAM | CUTANEOUS | Status: DC
Start: 2019-04-23 — End: 2019-04-24

## 2019-04-24 MED ORDER — Medication
650.00 | Status: DC
Start: 2019-04-23 — End: 2019-04-24

## 2019-04-24 MED ORDER — METHADOSE 5 MG PO TABS
4.00 | ORAL_TABLET | ORAL | Status: DC
Start: ? — End: 2019-04-24

## 2019-04-24 MED ORDER — BARO-CAT PO
1000.00 | ORAL | Status: DC
Start: ? — End: 2019-04-24

## 2019-04-24 MED ORDER — Medication
0.04 | Status: DC
Start: ? — End: 2019-04-24

## 2019-04-24 MED ORDER — BAYER WOMENS 81-300 MG PO TABS
40.00 | ORAL_TABLET | ORAL | Status: DC
Start: 2019-04-23 — End: 2019-04-24

## 2019-04-24 MED ORDER — Medication
0.08 | Status: DC
Start: ? — End: 2019-04-24

## 2019-04-24 MED ORDER — NYSTATIN 100000 UNIT/ML MT SUSP
400000.00 | OROMUCOSAL | Status: DC
Start: ? — End: 2019-04-24

## 2019-04-24 MED ORDER — PHENYLEPHRINE-GUAIFENESIN 20-375 MG PO CP12
10.00 | ORAL_CAPSULE | ORAL | Status: DC
Start: ? — End: 2019-04-24

## 2019-04-24 MED ORDER — POTASSIUM CHLORIDE IN NACL 20-0.9 MEQ/L-% IV SOLN
100.00 | INTRAVENOUS | Status: DC
Start: ? — End: 2019-04-24

## 2019-04-24 MED ORDER — CELLULOSE SODIUM PHOSPHATE VI
5000.00 | Status: DC
Start: 2019-04-23 — End: 2019-04-24

## 2019-05-11 DIAGNOSIS — Z713 Dietary counseling and surveillance: Secondary | ICD-10-CM | POA: Diagnosis not present

## 2019-05-22 ENCOUNTER — Encounter: Payer: Self-pay | Admitting: Family Medicine

## 2019-07-17 DIAGNOSIS — Z713 Dietary counseling and surveillance: Secondary | ICD-10-CM | POA: Diagnosis not present

## 2019-08-13 ENCOUNTER — Other Ambulatory Visit: Payer: Self-pay

## 2019-08-14 ENCOUNTER — Encounter: Payer: Self-pay | Admitting: Family Medicine

## 2019-08-14 ENCOUNTER — Ambulatory Visit (INDEPENDENT_AMBULATORY_CARE_PROVIDER_SITE_OTHER): Payer: Federal, State, Local not specified - PPO | Admitting: Family Medicine

## 2019-08-14 ENCOUNTER — Other Ambulatory Visit: Payer: Self-pay | Admitting: Family Medicine

## 2019-08-14 DIAGNOSIS — F419 Anxiety disorder, unspecified: Secondary | ICD-10-CM | POA: Diagnosis not present

## 2019-08-14 DIAGNOSIS — Z1231 Encounter for screening mammogram for malignant neoplasm of breast: Secondary | ICD-10-CM

## 2019-08-14 DIAGNOSIS — H5203 Hypermetropia, bilateral: Secondary | ICD-10-CM | POA: Diagnosis not present

## 2019-08-14 MED ORDER — BUPROPION HCL ER (XL) 300 MG PO TB24: 300.0000 mg | ORAL_TABLET | Freq: Every day | ORAL | 3 refills | Status: DC

## 2019-08-14 NOTE — Progress Notes (Signed)
Subjective:    Patient ID: Chloe Newton, female    DOB: 1961-04-03, 58 y.o.   MRN: [id number]  HPI Patient is a very pleasant 58 year old Caucasian female who is here today requesting a refill on her Wellbutrin.  She is currently taking Wellbutrin XL 300 mg daily to help control anxiety and depression.  She sees significant benefit from the medication.  She jokingly says that without the medication her husband would leave her or kill her.  Therefore she definitely wants to continue the medication.  She denies any side effects from it.  She denies any seizures.  She denies any headaches or nausea.  She denies any breakthrough anxiety or worsening depression symptoms..  She is due for a flu shot.  She is also due for Pneumovax 23 because she is a smoker.  She had a mammogram last November and she is scheduling another one this November.  She is overdue for a Pap smear.  Her colonoscopy was performed in 2019 and is up-to-date.  She has a history of an abnormal Pap smear and therefore I strongly encouraged her to schedule the Pap smear as soon as possible.  She sees a bariatric clinic because she has a history of gastric bypass surgery.  They are checking her lab work there twice a year including her cholesterol.  She will see them again in November or early December and would like to get the blood work checked there and then send [place] a copy. Past Medical History:  Diagnosis Date  . Abnormal finding on Pap smear, HPV DNA positive 11/13/11   Normal Cytology. Positive HPV 16, 18  . Anxiety   . History of colposcopy with cervical biopsy 12/07/11   Past Surgical History:  Procedure Laterality Date  . BARIATRIC SURGERY     RNY  . CESAREAN SECTION    . TUBAL LIGATION     Current Outpatient Medications on File Prior to Visit  Medication Sig Dispense Refill  . buPROPion (WELLBUTRIN XL) 300 MG 24 hr tablet Take 1 tablet (300 mg total) by mouth daily. 90 tablet 3  . cholecalciferol (VITAMIN D) 1000 units  tablet Take 1 tablet (1,000 Units total) by mouth daily.    . valACYclovir (VALTREX) 500 MG tablet Take 1 tablet (500 mg total) by mouth daily. 90 tablet 3   No current facility-administered medications on file prior to visit.    No Known Allergies Social History   Socioeconomic History  . Marital status: Married    Spouse name: Not on file  . Number of children: Not on file  . Years of education: Not on file  . Highest education level: Not on file  Occupational History  . Not on file  Social Needs  . Financial resource strain: Not on file  . Food insecurity    Worry: Not on file    Inability: Not on file  . Transportation needs    Medical: Not on file    Non-medical: Not on file  Tobacco Use  . Smoking status: Former Research scientist (life sciences)  . Smokeless tobacco: Never Used  Substance and Sexual Activity  . Alcohol use: Yes  . Drug use: No  . Sexual activity: Not on file  Lifestyle  . Physical activity    Days per week: Not on file    Minutes per session: Not on file  . Stress: Not on file  Relationships  . Social connections    Talks on phone: Not on file  Gets together: Not on file    Attends religious service: Not on file    Active member of club or organization: Not on file    Attends meetings of clubs or organizations: Not on file    Relationship status: Not on file  . Intimate partner violence    Fear of current or ex partner: Not on file    Emotionally abused: Not on file    Physically abused: Not on file    Forced sexual activity: Not on file  Other Topics Concern  . 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   For exercise she walks 30-45 minutes 3-4 days per week. (As of 03/2014)      Review of Systems  All other systems reviewed and are negative.      Objective:   Physical Exam Vitals signs reviewed.  Constitutional:      Appearance: Normal appearance.  Cardiovascular:     Rate and  Rhythm: Normal rate and regular rhythm.     Pulses: Normal pulses.     Heart sounds: Normal heart sounds. No murmur. No friction rub. No gallop.   Pulmonary:     Effort: Pulmonary effort is normal. No respiratory distress.     Breath sounds: Normal breath sounds. No stridor. No wheezing, rhonchi or rales.  Abdominal:     General: Abdomen is flat. Bowel sounds are normal.     Palpations: Abdomen is soft.  Neurological:     General: No focal deficit present.     Mental Status: She is alert and oriented to person, place, and time.     Cranial Nerves: No cranial nerve deficit.     Motor: No weakness.     Coordination: Coordination normal.           Assessment & Plan:  Anxiety - Plan: buPROPion (WELLBUTRIN XL) 300 MG 24 hr tablet  Patient is very happy with the success of the Wellbutrin.  She does not want to make any changes.  She requests a 90-day supply for 1 year.  I will gladly give this to her.  Strongly recommended smoking cessation.  Encouraged the patient to schedule her Pap smear soon as possible.  She will also schedule her mammogram.  Her colonoscopy is up-to-date.  I recommended the shingles vaccine, the flu shot, and the pneumonia vaccine.  She would like to consider this prior to receiving the shots.

## 2019-08-17 ENCOUNTER — Other Ambulatory Visit: Payer: Self-pay

## 2019-08-17 ENCOUNTER — Ambulatory Visit
Admission: RE | Admit: 2019-08-17 | Discharge: 2019-08-17 | Disposition: A | Discharge status: Home / Self Care | Payer: Federal, State, Local not specified - PPO | Source: Ambulatory Visit | Attending: Family Medicine | Admitting: Family Medicine

## 2019-08-17 DIAGNOSIS — Z1231 Encounter for screening mammogram for malignant neoplasm of breast: Secondary | ICD-10-CM | POA: Diagnosis not present

## 2019-08-19 ENCOUNTER — Other Ambulatory Visit: Payer: Self-pay

## 2019-08-20 ENCOUNTER — Encounter: Payer: Self-pay | Admitting: Family Medicine

## 2019-08-20 ENCOUNTER — Ambulatory Visit (INDEPENDENT_AMBULATORY_CARE_PROVIDER_SITE_OTHER): Payer: Federal, State, Local not specified - PPO | Admitting: Family Medicine

## 2019-08-20 VITALS — BP 104/62 | HR 80 | Temp 96.6°F | Resp 14 | Ht 62.0 in | Wt 170.0 lb

## 2019-08-20 DIAGNOSIS — Z01419 Encounter for gynecological examination (general) (routine) without abnormal findings: Secondary | ICD-10-CM

## 2019-08-20 DIAGNOSIS — Z23 Encounter for immunization: Secondary | ICD-10-CM

## 2019-08-20 DIAGNOSIS — N941 Unspecified dyspareunia: Secondary | ICD-10-CM | POA: Diagnosis not present

## 2019-08-20 DIAGNOSIS — Z124 Encounter for screening for malignant neoplasm of cervix: Secondary | ICD-10-CM | POA: Diagnosis not present

## 2019-08-20 MED ORDER — ESTRADIOL 0.1 MG/GM VA CREA: 1.0000 | TOPICAL_CREAM | Freq: Every day | VAGINAL | 12 refills | Status: DC

## 2019-08-20 NOTE — Addendum Note (Signed)
Addended by: Shary Decamp B on: 08/20/2019 11:50 AM   Modules accepted: Orders

## 2019-08-20 NOTE — Progress Notes (Signed)
Subjective:    Patient ID: Chloe Newton, female    DOB: Dec 14, 1960, 58 y.o.   MRN: [id number]  HPI  Patient is here today for her Pap smear.  Her only other concern is that she reports pain with intercourse.  She attributes it to lack of moisture and lubrication.  Physical exam does show atrophic vaginitis consistent with age.  There is no visible abnormalities externally.  Patient does have tenderness upon insertion of the speculum.  Cervix visually appears normal.  She does have some mild pain with bimanual exam although there are no palpable abnormalities in the adnexa or on the uterus. Past Medical History:  Diagnosis Date  . Abnormal finding on Pap smear, HPV DNA positive 11/13/11   Normal Cytology. Positive HPV 16, 18  . Anxiety   . History of colposcopy with cervical biopsy 12/07/11   Past Surgical History:  Procedure Laterality Date  . BARIATRIC SURGERY     RNY  . CESAREAN SECTION    . TUBAL LIGATION     Current Outpatient Medications on File Prior to Visit  Medication Sig Dispense Refill  . buPROPion (WELLBUTRIN XL) 300 MG 24 hr tablet Take 1 tablet (300 mg total) by mouth daily. 90 tablet 3  . cholecalciferol (VITAMIN D) 1000 units tablet Take 1 tablet (1,000 Units total) by mouth daily.    . ursodiol (ACTIGALL) 300 MG capsule Take one tab twice a day with food BEGINNING 14 DAYS AFTER SURGERY    . valACYclovir (VALTREX) 500 MG tablet Take 1 tablet (500 mg total) by mouth daily. 90 tablet 3   No current facility-administered medications on file prior to visit.    No Known Allergies Social History   Socioeconomic History  . Marital status: Married    Spouse name: Not on file  . Number of children: Not on file  . Years of education: Not on file  . Highest education level: Not on file  Occupational History  . Not on file  Social Needs  . Financial resource strain: Not on file  . Food insecurity    Worry: Not on file    Inability: Not on file  .  Transportation needs    Medical: Not on file    Non-medical: Not on file  Tobacco Use  . Smoking status: Former Games developer  . Smokeless tobacco: Never Used  Substance and Sexual Activity  . Alcohol use: Yes  . Drug use: No  . Sexual activity: Not on file  Lifestyle  . Physical activity    Days per week: Not on file    Minutes per session: Not on file  . Stress: Not on file  Relationships  . Social Musician on phone: Not on file    Gets together: Not on file    Attends religious service: Not on file    Active member of club or organization: Not on file    Attends meetings of clubs or organizations: Not on file    Relationship status: Not on file  . Intimate partner violence    Fear of current or ex partner: Not on file    Emotionally abused: Not on file    Physically abused: Not on file    Forced sexual activity: Not on file  Other Topics Concern  . 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   For exercise she walks 30-45 minutes 3-4 days  per week. (As of 03/2014)     Review of Systems  All other systems reviewed and are negative.      Objective:   Physical Exam Vitals signs reviewed. Exam conducted with a chaperone present.  Cardiovascular:     Rate and Rhythm: Normal rate and regular rhythm.  Pulmonary:     Effort: Pulmonary effort is normal.     Breath sounds: Normal breath sounds.  Genitourinary:    Exam position: Lithotomy position.     Labia:        Right: No rash or tenderness.        Left: No rash or tenderness.      Vagina: Tenderness present. No erythema or bleeding.     Cervix: No cervical motion tenderness, friability or erythema.     Uterus: Normal. Not enlarged.      Adnexa: Right adnexa normal and left adnexa normal.       Right: No mass.         Left: No mass.    Lymphadenopathy:     Lower Body: No right inguinal adenopathy. No left inguinal adenopathy.            Assessment & Plan:  Cervical cancer screening - Plan: PAP, Thin Prep w/HPV rflx HPV Type 16/18  I believe the patient's dyspareunia is most likely due to atrophic vaginitis.  Physical exam reveals no abnormalities or lesions that would explain it.  Therefore I recommended either lubrication such as K-Y jelly or potentially topical estrogen.  After discussing the risk and benefits of topical estrogen therapy, the patient would like to try it.  Therefore I sent a prescription for Estrace cream 1 applicator at bedtime daily.  If the patient does not see any benefit she will discontinue therapy.  We discussed the risk of endometrial cancer, breast cancer, and DVTs.

## 2019-08-24 LAB — PAP, TP IMAGING W/ HPV RNA, RFLX HPV TYPE 16,18/45: HPV DNA High Risk: NOT DETECTED

## 2019-10-09 DIAGNOSIS — K912 Postsurgical malabsorption, not elsewhere classified: Secondary | ICD-10-CM | POA: Diagnosis not present

## 2019-10-09 DIAGNOSIS — E7800 Pure hypercholesterolemia, unspecified: Secondary | ICD-10-CM | POA: Diagnosis not present

## 2019-10-09 DIAGNOSIS — E785 Hyperlipidemia, unspecified: Secondary | ICD-10-CM | POA: Diagnosis not present

## 2019-10-09 DIAGNOSIS — Z9884 Bariatric surgery status: Secondary | ICD-10-CM | POA: Diagnosis not present

## 2019-10-09 DIAGNOSIS — E559 Vitamin D deficiency, unspecified: Secondary | ICD-10-CM | POA: Diagnosis not present

## 2019-10-09 DIAGNOSIS — Z903 Acquired absence of stomach [part of]: Secondary | ICD-10-CM | POA: Diagnosis not present

## 2019-10-23 DIAGNOSIS — Z903 Acquired absence of stomach [part of]: Secondary | ICD-10-CM | POA: Diagnosis not present

## 2019-10-23 DIAGNOSIS — K912 Postsurgical malabsorption, not elsewhere classified: Secondary | ICD-10-CM | POA: Diagnosis not present

## 2019-10-23 DIAGNOSIS — Z9884 Bariatric surgery status: Secondary | ICD-10-CM | POA: Diagnosis not present

## 2020-03-09 ENCOUNTER — Other Ambulatory Visit: Payer: Self-pay | Admitting: Family Medicine

## 2020-03-09 ENCOUNTER — Telehealth: Payer: Self-pay | Admitting: Family Medicine

## 2020-03-09 MED ORDER — FLUCONAZOLE 150 MG PO TABS: ORAL_TABLET | ORAL | 0 refills | Status: DC

## 2020-03-09 NOTE — Telephone Encounter (Signed)
Pt has had itching, and burning but no discharged for 3 wks has tried ovc meds, but will then return.

## 2020-03-09 NOTE — Telephone Encounter (Signed)
Send Diflucan 150mg  po x 1, repeat in 3 days  #2 tabs If not improved needs OV

## 2020-03-09 NOTE — Telephone Encounter (Signed)
CB# [phone] Pt has yeast infection has take over the counter medication not helping any try no appt avaiable until to Tuesday can rx  be call into the pharmacy [facility] on [address]

## 2020-03-09 NOTE — Telephone Encounter (Signed)
Rx sent in K. Jeanice Lim, MD

## 2020-03-30 ENCOUNTER — Ambulatory Visit: Payer: Federal, State, Local not specified - PPO | Admitting: Nurse Practitioner

## 2020-03-30 ENCOUNTER — Other Ambulatory Visit: Payer: Self-pay

## 2020-03-30 DIAGNOSIS — N760 Acute vaginitis: Secondary | ICD-10-CM | POA: Diagnosis not present

## 2020-03-30 DIAGNOSIS — N898 Other specified noninflammatory disorders of vagina: Secondary | ICD-10-CM

## 2020-03-30 DIAGNOSIS — B9689 Other specified bacterial agents as the cause of diseases classified elsewhere: Secondary | ICD-10-CM | POA: Diagnosis not present

## 2020-03-30 LAB — WET PREP FOR TRICH, YEAST, CLUE

## 2020-03-30 MED ORDER — METRONIDAZOLE 500 MG PO TABS: 500.0000 mg | ORAL_TABLET | Freq: Two times a day (BID) | ORAL | 0 refills | Status: AC

## 2020-03-30 NOTE — Progress Notes (Signed)
Established Patient Office Visit  Subjective:  Patient ID: Chloe Newton, female    DOB: 03/16/1961  Age: 59 y.o. MRN: [id number]  CC: vaginal itching and burning for 1 week  HPI Chloe Newton is a 59 year old female presenting to the clinic for sxs of vaginal itching and burning for 1 week. She took a oral diflucan last week then repeated 2 days ago as her sxs persisted. She reports that she has scratched her vagina to the point of bringing blood and now having vaginal burning. She is married and has sexual encounter with her spouse about once weekly. She denied vaginal odor or change in vaginal discharge. She reported being postmenopausal for >5 years. She denied h/o frequent vaginal yeast infections. She denied recent change in soaps, detergents, lotions, outdoor or sport activities, not swimming or staying in wet bathing suit lately.   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 Positive HPV 16, 18   Anxiety    History of colposcopy with cervical biopsy 12/07/11    Past Surgical History:  Procedure Laterality Date   BARIATRIC SURGERY     RNY   CESAREAN SECTION     TUBAL LIGATION      Family History  Problem Relation Age of Onset   COPD Father    Heart disease Maternal Grandfather    Diabetes Maternal Grandmother    Cancer Paternal Grandmother    Cancer Paternal Grandfather        testicular   Hypertension Neg Hx     Social History   Socioeconomic History   Marital status: Married    Spouse name: Not on file   Number of children: Not on file   Years of education: Not on file   Highest education level: Not on file  Occupational History   Not on file  Tobacco Use   Smoking status: Former Smoker   Smokeless tobacco: Never Used  Substance and Sexual Activity   Alcohol use: Yes   Drug use: No   Sexual activity: Not on file  Other Topics Concern   Not on  file  Social History Narrative   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   For exercise she walks 30-45 minutes 3-4 days per week. (As of 03/2014)   Social Determinants of Health   Financial Resource Strain:    Difficulty of Paying Living Expenses:   Food Insecurity:    Worried About Programme researcher, broadcasting/film/video in the Last Year:    Barista in the Last Year:   Transportation Needs:    Freight forwarder (Medical):    Lack of Transportation (Non-Medical):   Physical Activity:    Days of Exercise per Week:    Minutes of Exercise per Session:   Stress:    Feeling of Stress :   Social Connections:    Frequency of Communication with Friends and Family:    Frequency of Social Gatherings with Friends and Family:    Attends Religious Services:    Active Member of Clubs or Organizations:    Attends Banker Meetings:    Marital Status:   Intimate Partner Violence:    Fear of Current or Ex-Partner:    Emotionally Abused:    Physically Abused:    Sexually Abused:     Outpatient Medications Prior to Visit  Medication Sig Dispense Refill  buPROPion (WELLBUTRIN XL) 300 MG 24 hr tablet Take 1 tablet (300 mg total) by mouth daily. 90 tablet 3   cholecalciferol (VITAMIN D) 1000 units tablet Take 1 tablet (1,000 Units total) by mouth daily.     ursodiol (ACTIGALL) 300 MG capsule Take one tab twice a day with food BEGINNING 14 DAYS AFTER SURGERY     valACYclovir (VALTREX) 500 MG tablet Take 1 tablet (500 mg total) by mouth daily. 90 tablet 3   estradiol (ESTRACE VAGINAL) 0.1 MG/GM vaginal cream Place 1 Applicatorful vaginally at bedtime. 42.5 g 12   fluconazole (DIFLUCAN) 150 MG tablet Take 1 take tablet by mouth 3 days apart. 2 tablet 0   No facility-administered medications prior to visit.    No Known Allergies  ROS Review of Systems  All other systems reviewed and are negative.     Objective:    Physical  Exam Vitals and nursing note reviewed. Exam conducted with a chaperone present.  Constitutional:      Appearance: Normal appearance.  HENT:     Head: Normocephalic.  Eyes:     General: No scleral icterus.    Extraocular Movements: Extraocular movements intact.     Conjunctiva/sclera: Conjunctivae normal.     Pupils: Pupils are equal, round, and reactive to light.  Abdominal:     General: There is no distension.  Genitourinary:    General: Normal vulva.     Exam position: Knee-chest position.     Pubic Area: No rash.      Labia:        Right: No rash, tenderness, lesion or injury.        Left: No rash, tenderness, lesion or injury.      Urethra: No prolapse, urethral pain, urethral swelling or urethral lesion.     Vagina: Vaginal discharge present.     Rectum: Normal.     Comments: Light green large amounts of milky consistency greenish tinted vaginal discharge present Musculoskeletal:     Cervical back: Normal range of motion and neck supple.     Right lower leg: No edema.     Left lower leg: No edema.  Skin:    General: Skin is warm and dry.     Coloration: Skin is not jaundiced or pale.     Findings: No bruising or erythema.  Neurological:     General: No focal deficit present.     Mental Status: She is alert and oriented to person, place, and time.  Psychiatric:        Attention and Perception: Attention and perception normal.        Mood and Affect: Mood and affect normal.        Speech: Speech normal.        Behavior: Behavior normal.        Thought Content: Thought content normal.        Cognition and Memory: Cognition normal.        Judgment: Judgment normal.     LMP 02/09/2014 Comment: only menses this year Wt Readings from Last 3 Encounters:  08/20/19 170 lb (77.1 kg)  08/14/19 172 lb (78 kg)  07/18/18 216 lb (98 kg)     Health Maintenance Due  Topic Date Due   COVID-19 Vaccine (1) Never done    There are no preventive care reminders to display for  this patient.  Lab Results  Component Value Date   TSH 1.49 07/18/2018   Lab Results  Component Value Date  WBC 6.3 07/18/2018   HGB 13.4 07/18/2018   HCT 39.6 07/18/2018   MCV 90.0 07/18/2018   PLT 284 07/18/2018   Lab Results  Component Value Date   NA 143 07/18/2018   K 4.7 07/18/2018   CO2 27 07/18/2018   GLUCOSE 94 07/18/2018   BUN 23 07/18/2018   CREATININE 0.81 07/18/2018   BILITOT 0.3 07/18/2018   ALKPHOS 48 05/25/2016   AST 24 07/18/2018   ALT 23 07/18/2018   PROT 6.7 07/18/2018   ALBUMIN 3.4 (L) 05/25/2016   CALCIUM 9.1 07/18/2018   Lab Results  Component Value Date   CHOL 240 (H) 07/18/2018   Lab Results  Component Value Date   HDL 89 07/18/2018   Lab Results  Component Value Date   LDLCALC 137 (H) 07/18/2018   Lab Results  Component Value Date   TRIG 52 07/18/2018   Lab Results  Component Value Date   CHOLHDL 2.7 07/18/2018   No results found for: HGBA1C    Assessment & Plan:   Problem List Items Addressed This Visit    None    Visit Diagnoses    Acute vaginitis    -  Primary   Relevant Orders   WET PREP FOR TRICH, YEAST, CLUE   Vaginal discharge       Bacterial vaginal infection       Relevant Medications   metroNIDAZOLE (FLAGYL) 500 MG tablet      Meds ordered this encounter  Medications   metroNIDAZOLE (FLAGYL) 500 MG tablet    Sig: Take 1 tablet (500 mg total) by mouth 2 (two) times daily for 7 days.    Dispense:  14 tablet    Refill:  0    Follow-up: Return if symptoms worsen or fail to improve.    Elmore Guise, FNP

## 2020-04-06 ENCOUNTER — Telehealth: Payer: Self-pay

## 2020-04-06 NOTE — Telephone Encounter (Signed)
Pt called stating the flagyl that was prescribed for her BV did not work at all and wanted to know if you can prescribe something else or refill this one. Please advise.

## 2020-04-07 ENCOUNTER — Other Ambulatory Visit: Payer: Self-pay | Admitting: Nurse Practitioner

## 2020-04-07 DIAGNOSIS — B9689 Other specified bacterial agents as the cause of diseases classified elsewhere: Secondary | ICD-10-CM

## 2020-04-07 MED ORDER — METRONIDAZOLE 0.75 % VA GEL: 1.0000 | Freq: Two times a day (BID) | VAGINAL | 0 refills | Status: DC

## 2020-04-07 NOTE — Progress Notes (Signed)
Failed oral Flagyl BV treatment.

## 2020-04-07 NOTE — Telephone Encounter (Signed)
Pt.notified

## 2020-04-07 NOTE — Telephone Encounter (Signed)
Sent vaginal insert for 5 days twice daily for failed oral therapy

## 2020-04-08 DIAGNOSIS — E785 Hyperlipidemia, unspecified: Secondary | ICD-10-CM | POA: Diagnosis not present

## 2020-04-08 DIAGNOSIS — Z9884 Bariatric surgery status: Secondary | ICD-10-CM | POA: Diagnosis not present

## 2020-04-08 DIAGNOSIS — E7800 Pure hypercholesterolemia, unspecified: Secondary | ICD-10-CM | POA: Diagnosis not present

## 2020-04-08 DIAGNOSIS — E559 Vitamin D deficiency, unspecified: Secondary | ICD-10-CM | POA: Diagnosis not present

## 2020-04-08 DIAGNOSIS — K912 Postsurgical malabsorption, not elsewhere classified: Secondary | ICD-10-CM | POA: Diagnosis not present

## 2020-04-18 ENCOUNTER — Other Ambulatory Visit: Payer: Self-pay

## 2020-04-18 ENCOUNTER — Ambulatory Visit (INDEPENDENT_AMBULATORY_CARE_PROVIDER_SITE_OTHER): Payer: Federal, State, Local not specified - PPO | Admitting: Nurse Practitioner

## 2020-04-18 VITALS — BP 108/70 | HR 69 | Temp 97.6°F | Resp 18 | Wt 140.4 lb

## 2020-04-18 DIAGNOSIS — B9689 Other specified bacterial agents as the cause of diseases classified elsewhere: Secondary | ICD-10-CM

## 2020-04-18 DIAGNOSIS — N760 Acute vaginitis: Secondary | ICD-10-CM | POA: Diagnosis not present

## 2020-04-18 DIAGNOSIS — N898 Other specified noninflammatory disorders of vagina: Secondary | ICD-10-CM

## 2020-04-18 LAB — WET PREP FOR TRICH, YEAST, CLUE

## 2020-04-18 MED ORDER — METRONIDAZOLE 0.75 % VA GEL: VAGINAL | 0 refills | Status: DC

## 2020-04-18 NOTE — Progress Notes (Signed)
Established Patient Office Visit  Subjective:  Patient ID: Chloe Newton, female    DOB: 29-Mar-1961  Age: 59 y.o. MRN: [id number]  CC:  Chief Complaint  Patient presents with  . Vaginitis    been going on for weeks, had noticeable discharge for the first time on 07/19, been on 2 diff antibiotics with no relief    HPI Chloe Newton is a 59 year old female presenting to clinic after being treated recently twice for BV. She reports that her sxs did start to imp[rove with the second treatment of vaginal medication but then vaginal discharge and odor increased. She does report one sexual intercourse with her spouse this week . She reports other sxs of mild vaginal itching and vaginal tissue edema with mild discomfort. She has tried no other txs. She does have h/o repeated BV infections. In the past she has tried Boric acid suppository that she is not sure if worked.   Past Medical History:  Diagnosis Date  . Abnormal finding on Pap smear, HPV DNA positive 11/13/11   Normal Cytology. Positive HPV 16, 18  . Anxiety   . History of colposcopy with cervical biopsy 12/07/11    Past Surgical History:  Procedure Laterality Date  . BARIATRIC SURGERY     RNY  . CESAREAN SECTION    . TUBAL LIGATION      Family History  Problem Relation Age of Onset  . COPD Father   . Heart disease Maternal Grandfather   . Diabetes Maternal Grandmother   . Cancer Paternal Grandmother   . Cancer Paternal Grandfather        testicular  . Hypertension Neg Hx     Social History   Socioeconomic History  . Marital status: Married    Spouse name: Not on file  . Number of children: Not on file  . Years of education: Not on file  . Highest education level: Not on file  Occupational History  . Not on file  Tobacco Use  . Smoking status: Former Games developer  . Smokeless tobacco: Never Used  Substance and Sexual Activity  . Alcohol use: Yes  . Drug use: No  . Sexual activity: Not on file    Other Topics Concern  . 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   For exercise she walks 30-45 minutes 3-4 days per week. (As of 03/2014)   Social Determinants of Health   Financial Resource Strain:   . Difficulty of Paying Living Expenses:   Food Insecurity:   . Worried About Programme researcher, broadcasting/film/video in the Last Year:   . Barista in the Last Year:   Transportation Needs:   . Freight forwarder (Medical):   Marland Kitchen Lack of Transportation (Non-Medical):   Physical Activity:   . Days of Exercise per Week:   . Minutes of Exercise per Session:   Stress:   . Feeling of Stress :   Social Connections:   . Frequency of Communication with Friends and Family:   . Frequency of Social Gatherings with Friends and Family:   . Attends Religious Services:   . Active Member of Clubs or Organizations:   . Attends Banker Meetings:   Marland Kitchen Marital Status:   Intimate Partner Violence:   . Fear of Current or Ex-Partner:   . Emotionally Abused:   Marland Kitchen Physically Abused:   . Sexually Abused:  Outpatient Medications Prior to Visit  Medication Sig Dispense Refill  . buPROPion (WELLBUTRIN XL) 300 MG 24 hr tablet Take 1 tablet (300 mg total) by mouth daily. 90 tablet 3  . cholecalciferol (VITAMIN D) 1000 units tablet Take 1 tablet (1,000 Units total) by mouth daily.    . valACYclovir (VALTREX) 500 MG tablet Take 1 tablet (500 mg total) by mouth daily. 90 tablet 3  . ursodiol (ACTIGALL) 300 MG capsule Take one tab twice a day with food BEGINNING 14 DAYS AFTER SURGERY     No facility-administered medications prior to visit.    No Known Allergies  ROS Review of Systems  All other systems reviewed and are negative.     Objective:    Physical Exam Vitals and nursing note reviewed. Exam conducted with a chaperone present.  Constitutional:      Appearance: Normal appearance. She is well-developed and well-groomed.   HENT:     Head: Normocephalic.  Eyes:     General: Lids are normal. Lids are everted, no foreign bodies appreciated.  Cardiovascular:     Rate and Rhythm: Normal rate.  Pulmonary:     Effort: Pulmonary effort is normal.  Genitourinary:    General: Normal vulva.     Exam position: Supine.     Vagina: Vaginal discharge present.     Rectum: Normal.  Musculoskeletal:     Cervical back: Normal range of motion and neck supple.  Skin:    General: Skin is warm and dry.     Coloration: Skin is not jaundiced.     Findings: No bruising or rash.  Neurological:     General: No focal deficit present.     Mental Status: She is alert and oriented to person, place, and time.  Psychiatric:        Attention and Perception: Attention and perception normal.        Mood and Affect: Mood and affect normal.        Speech: Speech normal.        Behavior: Behavior normal. Behavior is cooperative.        Cognition and Memory: Cognition normal.     BP 108/70 (BP Location: Right Arm, Patient Position: Sitting, Cuff Size: Normal)   Pulse 69   Temp 97.6 F (36.4 C) (Temporal)   Resp 18   Wt 140 lb 6.4 oz (63.7 kg)   LMP 02/09/2014 Comment: only menses this year  SpO2 99%   BMI 25.68 kg/m  Wt Readings from Last 3 Encounters:  04/18/20 140 lb 6.4 oz (63.7 kg)  08/20/19 170 lb (77.1 kg)  08/14/19 172 lb (78 kg)     Health Maintenance Due  Topic Date Due  . COVID-19 Vaccine (1) Never done    There are no preventive care reminders to display for this patient.  Lab Results  Component Value Date   TSH 1.49 07/18/2018   Lab Results  Component Value Date   WBC 6.3 07/18/2018   HGB 13.4 07/18/2018   HCT 39.6 07/18/2018   MCV 90.0 07/18/2018   PLT 284 07/18/2018   Lab Results  Component Value Date   NA 143 07/18/2018   K 4.7 07/18/2018   CO2 27 07/18/2018   GLUCOSE 94 07/18/2018   BUN 23 07/18/2018   CREATININE 0.81 07/18/2018   BILITOT 0.3 07/18/2018   ALKPHOS 48 05/25/2016    AST 24 07/18/2018   ALT 23 07/18/2018   PROT 6.7 07/18/2018   ALBUMIN 3.4 (L) 05/25/2016  CALCIUM 9.1 07/18/2018   Lab Results  Component Value Date   CHOL 240 (H) 07/18/2018   Lab Results  Component Value Date   HDL 89 07/18/2018   Lab Results  Component Value Date   LDLCALC 137 (H) 07/18/2018   Lab Results  Component Value Date   TRIG 52 07/18/2018   Lab Results  Component Value Date   CHOLHDL 2.7 07/18/2018   No results found for: HGBA1C    Assessment & Plan:   Problem List Items Addressed This Visit    None    Visit Diagnoses    Vaginal odor    -  Primary   Relevant Orders   WET PREP FOR TRICH, YEAST, CLUE (Completed)   Vaginal discharge       Relevant Orders   WET PREP FOR TRICH, YEAST, CLUE (Completed)   BV (bacterial vaginosis)       Relevant Medications   metroNIDAZOLE (METROGEL) 0.75 % vaginal gel     You have recurrent BV. Your treatment will be initial metronidazole 0.75% given as a 5 gram dose followed by twice weekly dose for 6 months.   Meds ordered this encounter  Medications  . metroNIDAZOLE (METROGEL) 0.75 % vaginal gel    Sig: initial metronidazole gel 0.75% given as a 5-gram dose followed by twice-weekly dosing of metronidazole 0.75% gel 5-gram dose for six months    Dispense:  245 g    Refill:  0    5 gram tube vaginal inserts. Dispense 48. This would be the initial 5 gram dose, then the twice weekly 5 gram dose for 6 months.    Follow-up: Return if symptoms worsen or fail to improve.    Elmore Guise, FNP

## 2020-05-03 ENCOUNTER — Other Ambulatory Visit: Payer: Self-pay | Admitting: Nurse Practitioner

## 2020-05-03 ENCOUNTER — Telehealth: Payer: Self-pay

## 2020-05-03 DIAGNOSIS — B9689 Other specified bacterial agents as the cause of diseases classified elsewhere: Secondary | ICD-10-CM

## 2020-05-03 NOTE — Telephone Encounter (Signed)
Pt called wanting a referral to OB for the ongoing vaginitis. Pt states it is not getting any better and that something has to be done. Please advise.

## 2020-05-16 ENCOUNTER — Ambulatory Visit: Payer: Federal, State, Local not specified - PPO | Admitting: Obstetrics and Gynecology

## 2020-05-16 ENCOUNTER — Other Ambulatory Visit: Payer: Self-pay

## 2020-05-16 ENCOUNTER — Encounter: Payer: Self-pay | Admitting: Obstetrics and Gynecology

## 2020-05-16 VITALS — BP 118/76 | Ht 63.0 in | Wt 137.0 lb

## 2020-05-16 DIAGNOSIS — N760 Acute vaginitis: Secondary | ICD-10-CM

## 2020-05-16 DIAGNOSIS — Z113 Encounter for screening for infections with a predominantly sexual mode of transmission: Secondary | ICD-10-CM | POA: Diagnosis not present

## 2020-05-16 DIAGNOSIS — B9689 Other specified bacterial agents as the cause of diseases classified elsewhere: Secondary | ICD-10-CM

## 2020-05-16 DIAGNOSIS — N898 Other specified noninflammatory disorders of vagina: Secondary | ICD-10-CM | POA: Diagnosis not present

## 2020-05-16 LAB — WET PREP FOR TRICH, YEAST, CLUE

## 2020-05-16 MED ORDER — NYSTATIN-TRIAMCINOLONE 100000-0.1 UNIT/GM-% EX OINT
1.0000 | TOPICAL_OINTMENT | Freq: Two times a day (BID) | CUTANEOUS | 0 refills | Status: DC
Start: 2020-05-16 — End: 2022-08-07

## 2020-05-16 MED ORDER — METRONIDAZOLE 0.75 % VA GEL: VAGINAL | 3 refills | Status: DC

## 2020-05-16 NOTE — Patient Instructions (Addendum)
Stop using the baby wipes  Use a mild soap on bottom, but while there is irritation, try to use water only and avoid scrubbing with wash cloth I do NOT think the external vulvar/vaginal irritation you are experiencing is from bacterial vaginosis since the test was negative today I think you probably have a bit of a skin inflammation perhaps brought on by irritation from the baby wipes leading to a itch-scratch cycle which predisposes the skin area to a yeast infection I recommend using Mycolog ointment twice daily for 7 days on the outside vulvar area (not inside the vagina) We did also test for gonorrhea and chlamydia and I will let you know the results when available Continue using the vaginal MetroGel as prescribed by your primary, you may also consider a vaginal probiotic capsule daily x1 week (made by [employer]) to help reestablish the vaginal flora balance

## 2020-05-16 NOTE — Progress Notes (Signed)
Rebacca Votaw 04/28/1961 [id number]  SUBJECTIVE:  59 y.o. G1P1 female presents for evaluation of a vaginal itching and irritation.  Minimal discharge and no vaginal odor.   She has been treated for bacterial vaginosis multiple times this past year.  Has tried oral Flagyl and MetroGel (lower concentrations of 0.75%) with minimal improvement of symptoms.  Apparently she has a history of recurrent bacterial vaginosis and has tried boric acid capsules suppositories in the past 10-12 years ago.  Sexually active with husband (but not since these symptoms started several weeks ago).  Itchy on vulvar area and back to rectum, wiping front to back.  Uses soap and water and baby wipes to cleanse.  She did have +clue cells on most recent vaginal wet prep and was treated with metrogel 0.75% (5 g dose) followed by twice weekly dose x 6 months for suppression and she indicates the symptoms have not really improved.  Again she is most concerned about the external vulvar burning and itching, applying the MetroGel to the outside vulvar area provides some soothing relief.   Current Outpatient Medications  Medication Sig Dispense Refill  . buPROPion (WELLBUTRIN XL) 300 MG 24 hr tablet Take 1 tablet (300 mg total) by mouth daily. 90 tablet 3  . Calcium Carb-Cholecalciferol (CALCIUM 1000 + D PO) Take by mouth.    . cholecalciferol (VITAMIN D) 1000 units tablet Take 1 tablet (1,000 Units total) by mouth daily.    . metroNIDAZOLE (METROGEL) 0.75 % vaginal gel initial metronidazole gel 0.75% given as a 5-gram dose followed by twice-weekly dosing of metronidazole 0.75% gel 5-gram dose for six months 245 g 0  . Multiple Vitamin (MULTIVITAMIN) tablet Take 1 tablet by mouth daily.    . valACYclovir (VALTREX) 500 MG tablet Take 1 tablet (500 mg total) by mouth daily. 90 tablet 3   No current facility-administered medications for this visit.   Allergies: Patient has no known allergies.  Patient's last menstrual period  was 02/09/2014.  Past medical history,surgical history, problem list, medications, allergies, family history and social history were all reviewed and documented as reviewed in the EPIC chart.  ROS:  Feeling well. No dyspnea or chest pain on exertion.  No abdominal pain, change in bowel habits, black or bloody stools.  No urinary tract symptoms. GYN ROS: No abnormal bleeding, vaginal symptoms as described in HPI   OBJECTIVE:  BP 118/76   Ht 5\' 3"  (1.6 m)   Wt 137 lb (62.1 kg)   LMP 02/09/2014 Comment: only menses this year  BMI 24.27 kg/m  The patient appears well, alert, oriented x 3, in no distress. PELVIC EXAM: VULVA: normal appearing vulva with no masses, tenderness or lesions, atrophic changes noted, rim of mildly erythematous tissue in a figure-of-eight pattern following the hairbearing areas of the labia majora crossing over the perineum and into the perirectal area, VAGINA: normal appearing vagina with normal color, copious white creamy discharge appears to be medicinal (patient indicates she used intravaginal MetroGel yesterday and bathed today), minimal true vaginal discharge, no lesions, atrophic changes, CERVIX: normal appearing cervix without discharge or lesions, WET MOUNT done - results: negative for pathogens, normal epithelial cells, moderate bacteria, few WBC  Chaperone: 04/11/2014 present during the examination  ASSESSMENT:  59 y.o. G1P1 here for vulvovaginal skin irritation, history of recurrent bacterial vaginosis  PLAN:  Discussed no evidence of BV on vaginal wet mount today.  Given her history of BV recurrence, I would continue the MetroGel suppression 0.75% 5 g  once weekly for the next 3 to 6 months.  May also consider a vaginal probiotic, suggested condom use when resuming sexual activity.  May need to consider vaginal boric acid capsule treatment in the future.  Discussed possibility of needing to consider vaginal estrogen once any acute inflammation has calmed down.   Risks of systemic estrogen absorption (thrombotic diseases, breast cancer, endometrial) are discussed today. She has a bit of a vulvar/perianal skin inflammation perhaps brought on by irritation from the baby wipes leading to a itch-scratch cycle which predisposes the skin area to a yeast infection.  She will use Mycolog ointment twice daily for 1 week.  She will stop using the baby wipes, change to a fragrance free mild moisturizing soap (such as Dove) and avoid use of any soap/use water only over the anterior/vulvar area, and avoid scrubbing with a washcloth. For completeness sake we did check GC/chlamydia but clinically I have a low suspicion based on the physical exam. RTC 1 week if no improvement in symptoms   Theresia Majors MD 05/16/20

## 2020-05-17 LAB — C. TRACHOMATIS/N. GONORRHOEAE RNA
C. trachomatis RNA, TMA: NOT DETECTED
N. gonorrhoeae RNA, TMA: NOT DETECTED

## 2020-05-31 ENCOUNTER — Ambulatory Visit: Payer: Federal, State, Local not specified - PPO | Admitting: Obstetrics and Gynecology

## 2020-06-16 DIAGNOSIS — Z9884 Bariatric surgery status: Secondary | ICD-10-CM | POA: Diagnosis not present

## 2020-06-16 DIAGNOSIS — K912 Postsurgical malabsorption, not elsewhere classified: Secondary | ICD-10-CM | POA: Diagnosis not present

## 2020-06-16 DIAGNOSIS — E559 Vitamin D deficiency, unspecified: Secondary | ICD-10-CM | POA: Diagnosis not present

## 2020-06-16 DIAGNOSIS — E7800 Pure hypercholesterolemia, unspecified: Secondary | ICD-10-CM | POA: Diagnosis not present

## 2020-08-12 ENCOUNTER — Ambulatory Visit (INDEPENDENT_AMBULATORY_CARE_PROVIDER_SITE_OTHER): Payer: Federal, State, Local not specified - PPO | Admitting: Family Medicine

## 2020-08-12 ENCOUNTER — Other Ambulatory Visit: Payer: Self-pay

## 2020-08-12 VITALS — BP 112/70 | HR 79 | Temp 97.8°F | Ht 63.0 in | Wt 141.0 lb

## 2020-08-12 DIAGNOSIS — Z0001 Encounter for general adult medical examination with abnormal findings: Secondary | ICD-10-CM | POA: Diagnosis not present

## 2020-08-12 DIAGNOSIS — Z0000 Encounter for general adult medical examination without abnormal findings: Secondary | ICD-10-CM

## 2020-08-12 DIAGNOSIS — F419 Anxiety disorder, unspecified: Secondary | ICD-10-CM

## 2020-08-12 MED ORDER — BUPROPION HCL ER (XL) 300 MG PO TB24: 300.0000 mg | ORAL_TABLET | Freq: Every day | ORAL | 3 refills | Status: DC

## 2020-08-12 NOTE — Progress Notes (Signed)
Subjective:    Patient ID: Chloe Newton, female    DOB: [birth year], 60 y.o.   MRN: [id number]  HPI  Patient is here today for a complete physical exam. Her mammogram was performed last year in November and was normal. She schedules this herself. Her Pap smear was performed last year was also normal. She is not due for that again until 2023. Her colonoscopy was performed in 2019 and was normal. She does not require another colonoscopy until 2029. She is already had her flu shot, she is just received her second dose of the Covid vaccine. She has had Pneumovax 23. Her tetanus shot is up-to-date. The only vaccine she could be due for is the shingles vaccine which we did discuss. Otherwise she is doing well. She has had a gastric bypass and she follows up regularly at [facility]. She had lab work drawn in July. This included a normal CBC, normal CMP, outstanding fasting lipid panel with an HDL cholesterol greater than 80! Iron levels, B12 level, B6 level, ferritin levels were all normal. Therefore I do not feel that she is due for any additional lab work. She is taking Wellbutrin and she would like this refilled that she feels that is beneficial Past Medical History:  Diagnosis Date  . Abnormal finding on Pap smear, HPV DNA positive 11/13/11   Normal Cytology. Positive HPV 16, 18  . Anxiety   . Fever blister   . History of colposcopy with cervical biopsy 12/07/11  . Sleep apnea    Phreesia 08/09/2020   Past Surgical History:  Procedure Laterality Date  . BARIATRIC SURGERY     RNY  . CESAREAN SECTION    . TUBAL LIGATION     Current Outpatient Medications on File Prior to Visit  Medication Sig Dispense Refill  . Calcium Carb-Cholecalciferol (CALCIUM 1000 + D PO) Take by mouth.    . cholecalciferol (VITAMIN D) 1000 units tablet Take 1 tablet (1,000 Units total) by mouth daily.    . metroNIDAZOLE (METROGEL) 0.75 % vaginal gel once-weekly intravaginal dosing of metronidazole 0.75% gel 5-gram  dose for six months 70 g 3  . Multiple Vitamin (MULTIVITAMIN) tablet Take 1 tablet by mouth daily.    Marland Kitchen nystatin-triamcinolone ointment (MYCOLOG) Apply 1 application topically 2 (two) times daily. 60 g 0  . valACYclovir (VALTREX) 500 MG tablet Take 1 tablet (500 mg total) by mouth daily. 90 tablet 3   No current facility-administered medications on file prior to visit.   No Known Allergies Social History   Socioeconomic History  . Marital status: Married    Spouse name: Not on file  . Number of children: Not on file  . Years of education: Not on file  . Highest education level: Not on file  Occupational History  . Not on file  Tobacco Use  . Smoking status: Former Games developer  . Smokeless tobacco: Never Used  Vaping Use  . Vaping Use: Former  Substance and Sexual Activity  . Alcohol use: Yes  . Drug use: No  . Sexual activity: Yes    Birth control/protection: Post-menopausal    Comment: 1st intercourse 59 yo-More than 5 partners  Other Topics Concern  . 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   For exercise she walks 30-45 minutes 3-4 days per week. (As of 03/2014)   Social Determinants of Health   Financial Resource Strain:   . Difficulty of  Paying Living Expenses: Not on file  Food Insecurity:   . Worried About Programme researcher, broadcasting/film/video in the Last Year: Not on file  . Ran Out of Food in the Last Year: Not on file  Transportation Needs:   . Lack of Transportation (Medical): Not on file  . Lack of Transportation (Non-Medical): Not on file  Physical Activity:   . Days of Exercise per Week: Not on file  . Minutes of Exercise per Session: Not on file  Stress:   . Feeling of Stress : Not on file  Social Connections:   . Frequency of Communication with Friends and Family: Not on file  . Frequency of Social Gatherings with Friends and Family: Not on file  . Attends Religious Services: Not on file  . Active  Member of Clubs or Organizations: Not on file  . Attends Banker Meetings: Not on file  . Marital Status: Not on file  Intimate Partner Violence:   . Fear of Current or Ex-Partner: Not on file  . Emotionally Abused: Not on file  . Physically Abused: Not on file  . Sexually Abused: Not on file     Review of Systems  All other systems reviewed and are negative.      Objective:   Physical Exam Vitals reviewed.  Constitutional:      General: She is not in acute distress.    Appearance: Normal appearance. She is normal weight. She is not ill-appearing, toxic-appearing or diaphoretic.  HENT:     Head: Normocephalic and atraumatic.     Right Ear: Tympanic membrane and ear canal normal.     Left Ear: Tympanic membrane and ear canal normal.     Nose: Nose normal. No congestion or rhinorrhea.     Mouth/Throat:     Mouth: Mucous membranes are moist.     Pharynx: Oropharynx is clear. No oropharyngeal exudate or posterior oropharyngeal erythema.  Eyes:     General:        Right eye: No discharge.        Left eye: No discharge.     Conjunctiva/sclera: Conjunctivae normal.     Pupils: Pupils are equal, round, and reactive to light.  Neck:     Vascular: No carotid bruit.  Cardiovascular:     Rate and Rhythm: Normal rate and regular rhythm.     Pulses: Normal pulses.     Heart sounds: Normal heart sounds. No murmur heard.  No friction rub. No gallop.   Pulmonary:     Effort: Pulmonary effort is normal. No respiratory distress.     Breath sounds: Normal breath sounds. No stridor. No wheezing, rhonchi or rales.  Chest:     Chest wall: No tenderness.  Abdominal:     General: Abdomen is flat. Bowel sounds are normal. There is no distension.     Palpations: Abdomen is soft.     Tenderness: There is no abdominal tenderness. There is no guarding or rebound.  Genitourinary:    Adnexa: Right adnexa normal and left adnexa normal.       Right: No mass.         Left: No mass.     Musculoskeletal:     Cervical back: Normal range of motion and neck supple. No rigidity.     Right lower leg: No edema.     Left lower leg: No edema.  Lymphadenopathy:     Cervical: No cervical adenopathy.  Skin:    Coloration: Skin is  not jaundiced.     Findings: No bruising, erythema, lesion or rash.  Neurological:     General: No focal deficit present.     Mental Status: She is alert and oriented to person, place, and time. Mental status is at baseline.     Cranial Nerves: No cranial nerve deficit.     Sensory: No sensory deficit.     Motor: No weakness.     Coordination: Coordination normal.     Gait: Gait normal.     Deep Tendon Reflexes: Reflexes normal.           Assessment & Plan:  General medical exam  Anxiety - Plan: buPROPion (WELLBUTRIN XL) 300 MG 24 hr tablet  Patient's physical exam today looks outstanding. Lab work is excellent. Immunizations are up-to-date except for the shingles vaccine. She prefers to schedule her own mammogram. The remainder of her cancer screening is up-to-date. She did smoke when she was a teenager until her 30s but she does not qualify for lung cancer screening. Regular anticipatory guidance is provided.

## 2020-09-05 ENCOUNTER — Other Ambulatory Visit: Payer: Self-pay | Admitting: Family Medicine

## 2020-09-05 DIAGNOSIS — Z1231 Encounter for screening mammogram for malignant neoplasm of breast: Secondary | ICD-10-CM

## 2020-10-14 ENCOUNTER — Ambulatory Visit: Payer: Federal, State, Local not specified - PPO

## 2020-11-04 ENCOUNTER — Ambulatory Visit: Payer: Federal, State, Local not specified - PPO

## 2020-12-19 ENCOUNTER — Ambulatory Visit
Admission: RE | Admit: 2020-12-19 | Discharge: 2020-12-19 | Disposition: A | Discharge status: Home / Self Care | Payer: Federal, State, Local not specified - PPO | Source: Ambulatory Visit | Attending: Family Medicine | Admitting: Family Medicine

## 2020-12-19 ENCOUNTER — Other Ambulatory Visit: Payer: Self-pay

## 2020-12-19 DIAGNOSIS — Z1231 Encounter for screening mammogram for malignant neoplasm of breast: Secondary | ICD-10-CM | POA: Diagnosis not present

## 2021-03-10 DIAGNOSIS — M8589 Other specified disorders of bone density and structure, multiple sites: Secondary | ICD-10-CM | POA: Diagnosis not present

## 2021-03-10 DIAGNOSIS — M85851 Other specified disorders of bone density and structure, right thigh: Secondary | ICD-10-CM | POA: Diagnosis not present

## 2021-08-01 ENCOUNTER — Other Ambulatory Visit: Payer: Self-pay

## 2021-08-01 ENCOUNTER — Ambulatory Visit: Payer: Federal, State, Local not specified - PPO | Admitting: Family Medicine

## 2021-08-01 DIAGNOSIS — F419 Anxiety disorder, unspecified: Secondary | ICD-10-CM | POA: Diagnosis not present

## 2021-08-01 MED ORDER — ALPRAZOLAM 0.5 MG PO TABS: 0.5000 mg | ORAL_TABLET | Freq: Three times a day (TID) | ORAL | 0 refills | Status: DC | PRN

## 2021-08-01 MED ORDER — BUPROPION HCL ER (XL) 300 MG PO TB24: 300.0000 mg | ORAL_TABLET | Freq: Every day | ORAL | 3 refills | Status: DC

## 2021-08-01 NOTE — Progress Notes (Signed)
Subjective:    Patient ID: Chloe Newton, female    DOB: [birth year], 60 y.o.   MRN: [id number]  HPI Patient presents today to get a refill on her Wellbutrin.  Unfortunately she recently discovered that her husband had been unfaithful.  They are currently separating and starting marriage counseling.  As a result however she is having tremendous anxiety and stress and is seeking assistance in this.  She is currently on Wellbutrin 300 mg daily.  This was working well for her up until the most recent revelation regarding her husband's infidelity.  She denies any suicidal ideation.  She denies any homicidal ideation.  She is having insomnia Past Medical History:  Diagnosis Date   Abnormal finding on Pap smear, HPV DNA positive 11/13/11   Normal Cytology. Positive HPV 16, 18   Anxiety    Fever blister    History of colposcopy with cervical biopsy 12/07/11   Sleep apnea    Phreesia 08/09/2020   Past Surgical History:  Procedure Laterality Date   BARIATRIC SURGERY     RNY   CESAREAN SECTION     TUBAL LIGATION     Current Outpatient Medications on File Prior to Visit  Medication Sig Dispense Refill   Calcium Carb-Cholecalciferol (CALCIUM 1000 + D PO) Take by mouth.     cholecalciferol (VITAMIN D) 1000 units tablet Take 1 tablet (1,000 Units total) by mouth daily.     Multiple Vitamin (MULTIVITAMIN) tablet Take 1 tablet by mouth daily.     nystatin-triamcinolone ointment (MYCOLOG) Apply 1 application topically 2 (two) times daily. 60 g 0   Omega-3 Fatty Acids (FISH OIL) 1200 MG CAPS Take 2 capsules by mouth daily.     valACYclovir (VALTREX) 500 MG tablet Take 1 tablet (500 mg total) by mouth daily. 90 tablet 3   zinc gluconate 50 MG tablet Take 50 mg by mouth daily.     No current facility-administered medications on file prior to visit.   No Known Allergies Social History   Socioeconomic History   Marital status: Married    Spouse name: Not on file   Number of children: Not on  file   Years of education: Not on file   Highest education level: Not on file  Occupational History   Not on file  Tobacco Use   Smoking status: Former   Smokeless tobacco: Never  Vaping Use   Vaping Use: Former  Substance and Sexual Activity   Alcohol use: Yes   Drug use: No   Sexual activity: Yes    Birth control/protection: Post-menopausal    Comment: 1st intercourse 60 yo-More than 5 partners  Other Topics Concern   Not on file  Social History Narrative   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   For exercise she walks 30-45 minutes 3-4 days per week. (As of 03/2014)   Social Determinants of Health   Financial Resource Strain: Not on file  Food Insecurity: Not on file  Transportation Needs: Not on file  Physical Activity: Not on file  Stress: Not on file  Social Connections: Not on file  Intimate Partner Violence: Not on file      Review of Systems  All other systems reviewed and are negative.     Objective:   Physical Exam Vitals reviewed.  Constitutional:      Appearance: Normal appearance.  Cardiovascular:     Rate and Rhythm: Normal rate and regular rhythm.     Pulses:  Normal pulses.     Heart sounds: Normal heart sounds. No murmur heard.   No friction rub. No gallop.  Pulmonary:     Effort: Pulmonary effort is normal. No respiratory distress.     Breath sounds: Normal breath sounds. No stridor. No wheezing, rhonchi or rales.  Abdominal:     General: Abdomen is flat. Bowel sounds are normal.     Palpations: Abdomen is soft.  Neurological:     General: No focal deficit present.     Mental Status: She is alert and oriented to person, place, and time.     Cranial Nerves: No cranial nerve deficit.     Motor: No weakness.     Coordination: Coordination normal.          Assessment & Plan:  Anxiety - Plan: buPROPion (WELLBUTRIN XL) 300 MG 24 hr tablet I will continue Wellbutrin extended release 300 mg daily.  However I  will add Xanax 0.5 mg p.o. every 8 hours as needed anxiety and stress.  I counseled the patient not to operate heavy machinery or drive while taking the medication and we also discussed the risk of habituation and dependency.  However at the present time she is dealing with acute situational stress and I am hopeful that the medication can help her navigate the next several weeks.

## 2021-09-05 ENCOUNTER — Other Ambulatory Visit: Payer: Self-pay

## 2021-09-05 MED ORDER — ALPRAZOLAM 0.5 MG PO TABS: 0.5000 mg | ORAL_TABLET | Freq: Three times a day (TID) | ORAL | 0 refills | Status: DC | PRN

## 2021-09-05 NOTE — Telephone Encounter (Signed)
Pt called to report the addition of Xanax has helped her. She would like to continue this medication.   Please refill, thanks!

## 2021-10-11 ENCOUNTER — Other Ambulatory Visit: Payer: Self-pay

## 2021-10-11 NOTE — Telephone Encounter (Signed)
LOV 08/01/21 Last refill 09/05/21, #30, 0 refills  Pt is requesting additional refills so she doesn't have to call in every month.  Please review, thanks!

## 2021-10-12 MED ORDER — ALPRAZOLAM 0.5 MG PO TABS
0.5000 mg | ORAL_TABLET | Freq: Three times a day (TID) | ORAL | 0 refills | Status: DC | PRN
Start: 2021-10-12 — End: 2021-11-16

## 2021-11-07 ENCOUNTER — Other Ambulatory Visit: Payer: Self-pay | Admitting: Family Medicine

## 2021-11-07 DIAGNOSIS — Z1231 Encounter for screening mammogram for malignant neoplasm of breast: Secondary | ICD-10-CM

## 2021-11-16 ENCOUNTER — Other Ambulatory Visit: Payer: Self-pay

## 2021-11-16 MED ORDER — ALPRAZOLAM 0.5 MG PO TABS: 0.5000 mg | ORAL_TABLET | Freq: Three times a day (TID) | ORAL | 0 refills | Status: DC | PRN

## 2021-11-16 NOTE — Telephone Encounter (Signed)
LOV 08/01/21 Last refill 10/12/21, #30, 0 refills  Please review, thanks!

## 2021-12-20 ENCOUNTER — Ambulatory Visit
Admission: RE | Admit: 2021-12-20 | Discharge: 2021-12-20 | Disposition: A | Discharge status: Home / Self Care | Payer: Federal, State, Local not specified - PPO | Source: Ambulatory Visit | Attending: Family Medicine | Admitting: Family Medicine

## 2021-12-20 DIAGNOSIS — Z1231 Encounter for screening mammogram for malignant neoplasm of breast: Secondary | ICD-10-CM

## 2021-12-22 ENCOUNTER — Other Ambulatory Visit: Payer: Self-pay

## 2021-12-22 NOTE — Telephone Encounter (Signed)
LOV 08/01/21 ?Last refill 11/16/21, #30, 0 refills ? ?Please review, thanks! ? ?

## 2021-12-25 MED ORDER — ALPRAZOLAM 0.5 MG PO TABS: 0.5000 mg | ORAL_TABLET | Freq: Three times a day (TID) | ORAL | 0 refills | Status: DC | PRN

## 2022-02-05 ENCOUNTER — Other Ambulatory Visit: Payer: Self-pay

## 2022-02-05 MED ORDER — ALPRAZOLAM 0.5 MG PO TABS: 0.5000 mg | ORAL_TABLET | Freq: Three times a day (TID) | ORAL | 0 refills | Status: DC | PRN

## 2022-02-05 NOTE — Telephone Encounter (Signed)
LOV 08/01/21 ?Last refill 12/25/21, #30, 0 refills ? ?Please review, thanks! ? ?

## 2022-03-09 ENCOUNTER — Other Ambulatory Visit: Payer: Self-pay

## 2022-03-09 NOTE — Telephone Encounter (Signed)
Pt called in requesting a refill of ALPRAZolam (XANAX) 0.5 MG tablet [[id number]] to be sent to the [facility] pharmacy at [facility]. Please advise.  Cb#: [phone]

## 2022-03-12 NOTE — Telephone Encounter (Signed)
Requested medications are due for refill today.  yes  Requested medications are on the active medications list.  yes  Last refill. 02/05/2022 #30 0 refills  Future visit scheduled.   no  Notes to clinic.  Medication not delegated.    Requested Prescriptions  Pending Prescriptions Disp Refills   ALPRAZolam (XANAX) 0.5 MG tablet 30 tablet 0    Sig: Take 1 tablet (0.5 mg total) by mouth 3 (three) times daily as needed.     Not Delegated - Psychiatry: Anxiolytics/Hypnotics 2 Failed - 03/09/2022  3:33 PM      Failed - This refill cannot be delegated      Failed - Urine Drug Screen completed in last 360 days      Failed - Valid encounter within last 6 months    Recent Outpatient Visits           7 months ago [place] Pickard, Cammie Mcgee, MD   1 year ago General medical exam   [facility] Susy Frizzle, MD   1 year ago Vaginal odor   [place], [place], FNP   1 year ago Acute vaginitis   [place], [place], FNP   2 years ago Cervical cancer screening   [place], Cammie Mcgee, MD              Passed - Patient is not pregnant

## 2022-03-13 ENCOUNTER — Other Ambulatory Visit: Payer: Self-pay | Admitting: Family Medicine

## 2022-03-14 NOTE — Telephone Encounter (Signed)
Requested medications are due for refill today.  yes  Requested medications are on the active medications list.  yes  Last refill. 02/05/2022 #30 0 rf  Future visit scheduled.   no  Notes to clinic.  Medication refill is not delegated.    Requested Prescriptions  Pending Prescriptions Disp Refills   ALPRAZolam (XANAX) 0.5 MG tablet [Pharmacy Med Name: ALPRAZolam 0.5 MG Oral Tablet] 30 tablet 0    Sig: Take 1 tablet by mouth three times daily as needed     Not Delegated - Psychiatry: Anxiolytics/Hypnotics 2 Failed - 03/14/2022  8:11 AM      Failed - This refill cannot be delegated      Failed - Urine Drug Screen completed in last 360 days      Failed - Valid encounter within last 6 months    Recent Outpatient Visits           7 months ago Anxiety   [facility] Pickard, Priscille Heidelberg, MD   1 year ago General medical exam   [facility] Donita Brooks, MD   1 year ago Vaginal odor   [facility] Elmore Guise, FNP   1 year ago Acute vaginitis   [facility] Elmore Guise, FNP   2 years ago Cervical cancer screening   [facility] Pickard, Priscille Heidelberg, MD              Passed - Patient is not pregnant

## 2022-03-19 ENCOUNTER — Other Ambulatory Visit: Payer: Self-pay

## 2022-03-19 MED ORDER — ALPRAZOLAM 0.5 MG PO TABS: 0.5000 mg | ORAL_TABLET | Freq: Three times a day (TID) | ORAL | 0 refills | Status: DC | PRN

## 2022-03-19 NOTE — Telephone Encounter (Signed)
LOV 08/01/21 Last refill 02/05/22, #30, 0 refills  Please review, thanks!

## 2022-05-07 ENCOUNTER — Other Ambulatory Visit: Payer: Self-pay

## 2022-05-07 MED ORDER — ALPRAZOLAM 0.5 MG PO TABS: 0.5000 mg | ORAL_TABLET | Freq: Three times a day (TID) | ORAL | 0 refills | Status: DC | PRN

## 2022-05-07 NOTE — Telephone Encounter (Signed)
LOV 08/01/21 Last refill 03/19/22, #30, 0 refills  Please review, thanks!

## 2022-05-23 DIAGNOSIS — Z903 Acquired absence of stomach [part of]: Secondary | ICD-10-CM | POA: Diagnosis not present

## 2022-05-23 DIAGNOSIS — K912 Postsurgical malabsorption, not elsewhere classified: Secondary | ICD-10-CM | POA: Diagnosis not present

## 2022-05-23 DIAGNOSIS — Z9884 Bariatric surgery status: Secondary | ICD-10-CM | POA: Diagnosis not present

## 2022-06-11 ENCOUNTER — Telehealth: Payer: Self-pay

## 2022-06-11 ENCOUNTER — Other Ambulatory Visit: Payer: Self-pay | Admitting: Family Medicine

## 2022-06-11 DIAGNOSIS — Z6829 Body mass index (BMI) 29.0-29.9, adult: Secondary | ICD-10-CM | POA: Diagnosis not present

## 2022-06-11 DIAGNOSIS — Z9884 Bariatric surgery status: Secondary | ICD-10-CM | POA: Diagnosis not present

## 2022-06-11 DIAGNOSIS — E559 Vitamin D deficiency, unspecified: Secondary | ICD-10-CM | POA: Diagnosis not present

## 2022-06-11 DIAGNOSIS — R030 Elevated blood-pressure reading, without diagnosis of hypertension: Secondary | ICD-10-CM | POA: Diagnosis not present

## 2022-06-11 MED ORDER — ALPRAZOLAM 0.5 MG PO TABS
0.5000 mg | ORAL_TABLET | Freq: Three times a day (TID) | ORAL | 0 refills | Status: DC | PRN
Start: 2022-06-11 — End: 2022-07-17

## 2022-06-11 NOTE — Telephone Encounter (Signed)
Pt called requesting refill on Xanax. Last RF was 05/07/2022, Last OV was 08/01/2021. Pt uses Statistician at [facility]. Thank you.

## 2022-07-17 ENCOUNTER — Telehealth: Payer: Self-pay

## 2022-07-17 ENCOUNTER — Other Ambulatory Visit: Payer: Self-pay | Admitting: Family Medicine

## 2022-07-17 MED ORDER — ALPRAZOLAM 0.5 MG PO TABS: 0.5000 mg | ORAL_TABLET | Freq: Three times a day (TID) | ORAL | 0 refills | Status: DC | PRN

## 2022-07-17 NOTE — Telephone Encounter (Signed)
Pt called requesting a refill on her Xanax. Last RF was 06/11/2022. Last OV was 08/01/2021. Pt uses Paediatric nurse at [facility]. Thank you.

## 2022-08-07 ENCOUNTER — Encounter: Payer: Self-pay | Admitting: Family Medicine

## 2022-08-07 ENCOUNTER — Ambulatory Visit (INDEPENDENT_AMBULATORY_CARE_PROVIDER_SITE_OTHER): Payer: Federal, State, Local not specified - PPO | Admitting: Family Medicine

## 2022-08-07 VITALS — BP 122/72 | HR 67 | Ht 63.0 in | Wt 150.0 lb

## 2022-08-07 DIAGNOSIS — M5431 Sciatica, right side: Secondary | ICD-10-CM | POA: Diagnosis not present

## 2022-08-07 DIAGNOSIS — Z0000 Encounter for general adult medical examination without abnormal findings: Secondary | ICD-10-CM

## 2022-08-07 DIAGNOSIS — Z1211 Encounter for screening for malignant neoplasm of colon: Secondary | ICD-10-CM

## 2022-08-07 NOTE — Progress Notes (Signed)
Subjective:    Patient ID: Chloe Newton, female    DOB: Oct 04, 1960, 61 y.o.   MRN: [id number]  HPI  Patient is here today for a complete physical exam.  Her mammogram is up-to-date having been performed earlier this year.  She is due for a Pap smear.  She is also overdue for colonoscopy.  She is not due for a bone density test until age 18.  She is already had her COVID shot, her flu shot, and her shingles vaccine.  She is not due for a pneumonia vaccine until 61.  She is due for her Pap smear today but she defers this at the present time.  She has recently had all of her blood work including a CBC a CMP and a lipid panel checked by her bariatric surgeon.  She does not want to repeat that today.  I do not have a copy of the lab work for me to review.  She is taking calcium and vitamin D under the guidance of the bariatric surgeon.  Patient states that she has been dealing with right-sided sciatica off and on for the last several months.  She states occasionally she will have pain radiating into her right posterior hip and down her right leg especially if she has been sitting for long periods of time. 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 Positive HPV 16, 18   Anxiety    Fever blister    History of colposcopy with cervical biopsy 12/07/11   Sleep apnea    Phreesia 08/09/2020   Past Surgical History:  Procedure Laterality Date   BARIATRIC SURGERY     RNY   CESAREAN SECTION     TUBAL LIGATION     Current Outpatient Medications on File Prior to Visit  Medication Sig Dispense Refill   ALPRAZolam (XANAX) 0.5 MG tablet Take 1 tablet (0.5 mg total) by mouth 3 (three) times daily as needed. 30 tablet 0   buPROPion (WELLBUTRIN XL) 300 MG 24 hr tablet Take 1 tablet (300 mg total) by mouth daily. 90 tablet 3   Calcium Carb-Cholecalciferol (CALCIUM 1000 + D PO) Take by mouth.     cholecalciferol (VITAMIN D) 1000  units tablet Take 1 tablet (1,000 Units total) by mouth daily.     Multiple Vitamin (MULTIVITAMIN) tablet Take 1 tablet by mouth daily.     Omega-3 Fatty Acids (FISH OIL) 1200 MG CAPS Take 2 capsules by mouth daily.     valACYclovir (VALTREX) 500 MG tablet Take 1 tablet (500 mg total) by mouth daily. 90 tablet 3   zinc gluconate 50 MG tablet Take 50 mg by mouth daily.     No current facility-administered medications on file prior to visit.   No Known Allergies Social History   Socioeconomic History   Marital status: Married    Spouse name: Not on file   Number of children: Not on file   Years of education: Not on file   Highest education level: Not on file  Occupational History   Not on file  Tobacco Use   Smoking status: Former   Smokeless tobacco: Never  Vaping Use   Vaping Use: Former  Substance and Sexual Activity   Alcohol use: Yes   Drug use: No   Sexual activity: Yes    Birth control/protection: Post-menopausal    Comment: 1st intercourse 60 yo-More than 5 partners  Other Topics Concern  Not on file  Social History Narrative   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   For exercise she walks 30-45 minutes 3-4 days per week. (As of 03/2014)   Social Determinants of Health   Financial Resource Strain: Not on file  Food Insecurity: Not on file  Transportation Needs: Not on file  Physical Activity: Not on file  Stress: Not on file  Social Connections: Not on file  Intimate Partner Violence: Not on file     Review of Systems  All other systems reviewed and are negative.      Objective:   Physical Exam Vitals reviewed.  Constitutional:      General: She is not in acute distress.    Appearance: Normal appearance. She is normal weight. She is not ill-appearing, toxic-appearing or diaphoretic.  HENT:     Head: Normocephalic and atraumatic.     Right Ear: Tympanic membrane and ear canal normal.     Left Ear: Tympanic membrane and  ear canal normal.     Nose: Nose normal. No congestion or rhinorrhea.     Mouth/Throat:     Mouth: Mucous membranes are moist.     Pharynx: Oropharynx is clear. No oropharyngeal exudate or posterior oropharyngeal erythema.  Eyes:     General:        Right eye: No discharge.        Left eye: No discharge.     Conjunctiva/sclera: Conjunctivae normal.     Pupils: Pupils are equal, round, and reactive to light.  Neck:     Vascular: No carotid bruit.  Cardiovascular:     Rate and Rhythm: Normal rate and regular rhythm.     Pulses: Normal pulses.     Heart sounds: Normal heart sounds. No murmur heard.    No friction rub. No gallop.  Pulmonary:     Effort: Pulmonary effort is normal. No respiratory distress.     Breath sounds: Normal breath sounds. No stridor. No wheezing, rhonchi or rales.  Chest:     Chest wall: No tenderness.  Abdominal:     General: Abdomen is flat. Bowel sounds are normal. There is no distension.     Palpations: Abdomen is soft.     Tenderness: There is no abdominal tenderness. There is no guarding or rebound.  Musculoskeletal:     Cervical back: Normal range of motion and neck supple. No rigidity.     Right lower leg: No edema.     Left lower leg: No edema.  Lymphadenopathy:     Cervical: No cervical adenopathy.  Skin:    Coloration: Skin is not jaundiced.     Findings: No bruising, erythema, lesion or rash.  Neurological:     General: No focal deficit present.     Mental Status: She is alert and oriented to person, place, and time. Mental status is at baseline.     Cranial Nerves: No cranial nerve deficit.     Sensory: No sensory deficit.     Motor: No weakness.     Coordination: Coordination normal.     Gait: Gait normal.     Deep Tendon Reflexes: Reflexes normal.           Assessment & Plan:  Colon cancer screening - Plan: Ambulatory referral to Gastroenterology  Right sided sciatica - Plan: DG Lumbar Spine Complete  General medical  exam Patient's mammogram is up-to-date.  I will schedule the patient for a GI consultation for a  colonoscopy.  Attempt to obtain lab work from her Ambulance person but it sounds like is up-to-date.  Recommended a Pap smear but she declines this today.  Recommended getting an x-ray of the lumbar spine to evaluate for potential causes of her right-sided sciatica.  Flu shot, COVID shot, and shingles vaccine are up-to-date.  Recommended a bone density test today 64 and Prevnar 20 at age 57.

## 2022-08-21 ENCOUNTER — Telehealth: Payer: Self-pay

## 2022-08-21 NOTE — Telephone Encounter (Signed)
Pt called requesting refill on her Xanax. Last RF was 07/17/2022. Pt uses Statistician at [facility].

## 2022-08-22 ENCOUNTER — Ambulatory Visit: Payer: Federal, State, Local not specified - PPO | Admitting: Family Medicine

## 2022-08-22 VITALS — BP 120/70 | HR 81 | Ht 63.0 in | Wt 165.0 lb

## 2022-08-22 DIAGNOSIS — H9042 Sensorineural hearing loss, unilateral, left ear, with unrestricted hearing on the contralateral side: Secondary | ICD-10-CM | POA: Diagnosis not present

## 2022-08-22 DIAGNOSIS — H65192 Other acute nonsuppurative otitis media, left ear: Secondary | ICD-10-CM | POA: Diagnosis not present

## 2022-08-22 DIAGNOSIS — H6690 Otitis media, unspecified, unspecified ear: Secondary | ICD-10-CM | POA: Insufficient documentation

## 2022-08-22 MED ORDER — FLUCONAZOLE 150 MG PO TABS: 150.0000 mg | ORAL_TABLET | Freq: Once | ORAL | 0 refills | Status: AC

## 2022-08-22 MED ORDER — AMOXICILLIN 500 MG PO CAPS: 500.0000 mg | ORAL_CAPSULE | Freq: Two times a day (BID) | ORAL | 0 refills | Status: AC

## 2022-08-22 NOTE — Assessment & Plan Note (Addendum)
Patient is experiencing sensorineural hearing loss to her left ear with associated mild bulging TM. Will treat with Amoxicillin 500mg  BID for 10 days as well as refer to ENT for further evaluation if it is not resolved with the antibiotic. Weber and Rinne were positive as well as complete left sided hearing loss on audiometry exam. Discussed possible treatment with steroids but she declined.

## 2022-08-22 NOTE — Progress Notes (Signed)
Acute Office Visit  Subjective:     Patient ID: Chloe Newton, female    DOB: Aug 14, 1961, 61 y.o.   MRN: [id number]  Chief Complaint  Patient presents with   Hearing Problem    L ear pressure and "roar" since yesterday.     HPI Patient is in today for hearing loss in left ear for 1 day described as muffled sounds and associated with left ear pressure. She denies recent illness, pain, drainage, trauma, barotrauma, facial paralysis.  Review of Systems  Constitutional: Negative.   HENT:  Positive for hearing loss. Negative for congestion, ear discharge, ear pain and sinus pain.   Eyes: Negative.   Neurological: Negative.     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 Positive HPV 16, 18   Anxiety    Fever blister    History of colposcopy with cervical biopsy 12/07/11   Sleep apnea    Phreesia 08/09/2020   Past Surgical History:  Procedure Laterality Date   BARIATRIC SURGERY     RNY   CESAREAN SECTION     TUBAL LIGATION     Current Outpatient Medications on File Prior to Visit  Medication Sig Dispense Refill   ALPRAZolam (XANAX) 0.5 MG tablet Take 1 tablet (0.5 mg total) by mouth 3 (three) times daily as needed. 30 tablet 0   buPROPion (WELLBUTRIN XL) 300 MG 24 hr tablet Take 1 tablet (300 mg total) by mouth daily. 90 tablet 3   Calcium Carb-Cholecalciferol (CALCIUM 1000 + D PO) Take by mouth.     cholecalciferol (VITAMIN D) 1000 units tablet Take 1 tablet (1,000 Units total) by mouth daily.     Multiple Vitamin (MULTIVITAMIN) tablet Take 1 tablet by mouth daily.     Omega-3 Fatty Acids (FISH OIL) 1200 MG CAPS Take 2 capsules by mouth daily.     valACYclovir (VALTREX) 500 MG tablet Take 1 tablet (500 mg total) by mouth daily. 90 tablet 3   zinc gluconate 50 MG tablet Take 50 mg by mouth daily.     No current facility-administered medications on file prior to visit.   No Known Allergies      Objective:    BP 120/70   Pulse 81   Ht 5\' 3"  (1.6 m)   Wt 165 lb (74.8 kg)   LMP 02/09/2014 Comment: only menses this year  SpO2 98%   BMI 29.23 kg/m    Physical Exam Vitals and nursing note reviewed.  Constitutional:      Appearance: Normal appearance. She is normal weight.  HENT:     Head: Normocephalic and atraumatic.     Right Ear: Tympanic membrane, ear canal and external ear normal.     Left Ear: Ear canal and external ear normal. Tympanic membrane is bulging.     Ears:     Weber exam findings: Lateralizes right.    Right Rinne: AC > BC.    Comments: Left Rinne no sound heard with BC or AC Skin:    General: Skin is warm and dry.  Neurological:     General: No focal deficit present.     Mental Status: She is alert and oriented to person, place, and time. Mental status is at baseline.  Psychiatric:        Mood and Affect: Mood normal.        Behavior: Behavior normal.        Thought  Content: Thought content normal.        Judgment: Judgment normal.     No results found for any visits on 08/22/22.      Assessment & Plan:   Problem List Items Addressed This Visit       Nervous and Auditory   Otitis media   Relevant Medications   amoxicillin (AMOXIL) 500 MG capsule   fluconazole (DIFLUCAN) 150 MG tablet   Sensorineural hearing loss (SNHL) of left ear with unrestricted hearing of right ear - Primary    Patient is experiencing sensorineural hearing loss to her left ear with associated mild bulging TM. Will treat with Amoxicillin 500mg  BID for 10 days as well as refer to ENT for further evaluation if it is not resolved with the antibiotic. Weber and Rinne were positive as well as complete left sided hearing loss on audiometry exam. Discussed possible treatment with steroids but she declined.      Relevant Orders   Ambulatory referral to ENT    Meds ordered this encounter  Medications   amoxicillin (AMOXIL) 500 MG capsule    Sig: Take 1 capsule (500 mg  total) by mouth 2 (two) times daily for 10 days.    Dispense:  20 capsule    Refill:  0    Order Specific Question:   Supervising Provider    Answer:   T [[id number]]   fluconazole (DIFLUCAN) 150 MG tablet    Sig: Take 1 tablet (150 mg total) by mouth once for 1 dose.    Dispense:  1 tablet    Refill:  0    Order Specific Question:   Supervising Provider    Answer:   Lynnea Ferrier T [[id number]]    Return if symptoms worsen or fail to improve.  Lynnea Ferrier, FNP

## 2022-08-27 ENCOUNTER — Telehealth: Payer: Self-pay

## 2022-08-27 NOTE — Telephone Encounter (Signed)
Pt would like a refill on her Xanax 0.5 mg sent to [facility] at [employer]. Last RF was 07/17/22. Thank you.

## 2022-08-28 ENCOUNTER — Other Ambulatory Visit: Payer: Self-pay | Admitting: Family Medicine

## 2022-08-28 MED ORDER — ALPRAZOLAM 0.5 MG PO TABS: 0.5000 mg | ORAL_TABLET | Freq: Three times a day (TID) | ORAL | 0 refills | Status: DC | PRN

## 2022-09-27 IMAGING — MG MM DIGITAL SCREENING BILAT W/ TOMO AND CAD
6 of 10 series · 6 of 30 positions shown · non-contrast
Comparison: Previous exam(s).

CLINICAL DATA: Screening.

EXAM:
DIGITAL SCREENING BILATERAL MAMMOGRAM WITH TOMOSYNTHESIS AND CAD
TECHNIQUE: Bilateral screening digital craniocaudal and mediolateral oblique
mammograms were obtained. Bilateral screening digital breast
tomosynthesis was performed. The images were evaluated with
computer-aided detection.

[R MLO synth-2D (1 of 2)]
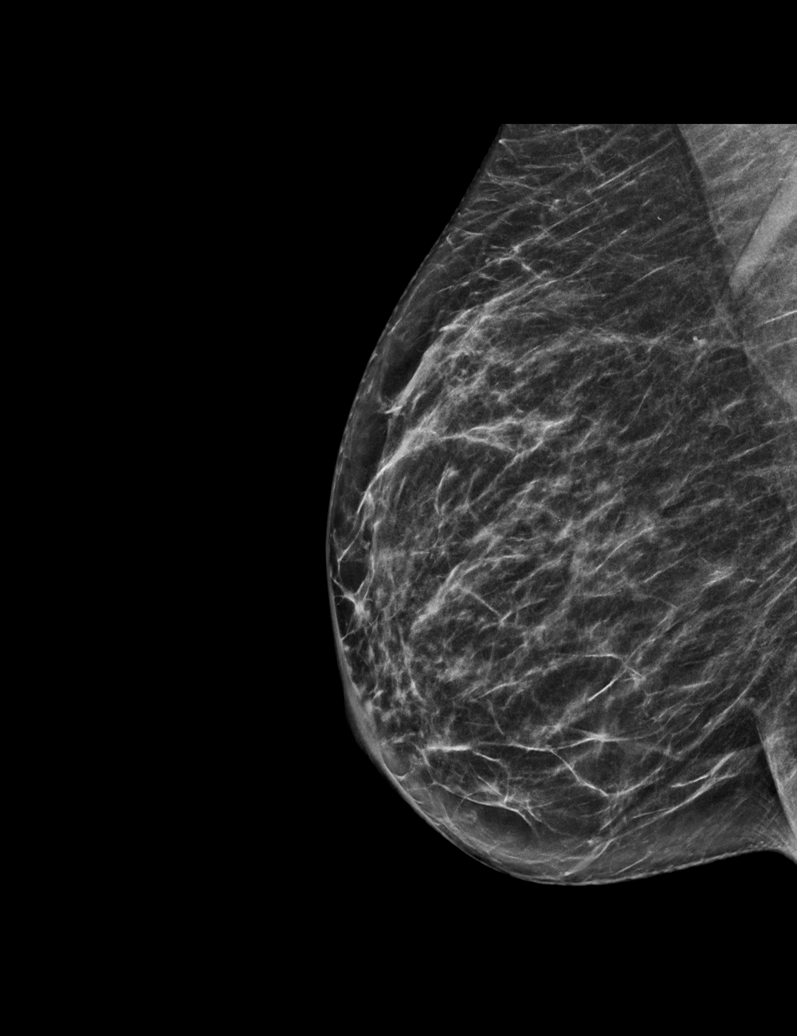

[L MLO synth-2D]
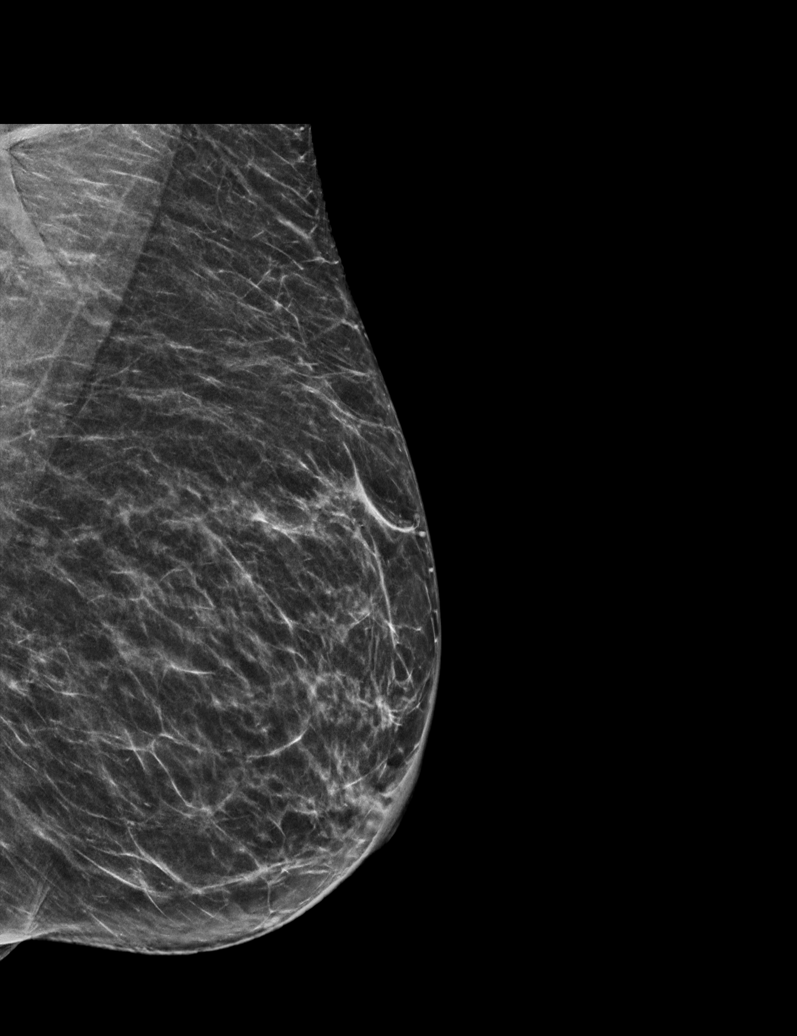

[L CC synth-2D]
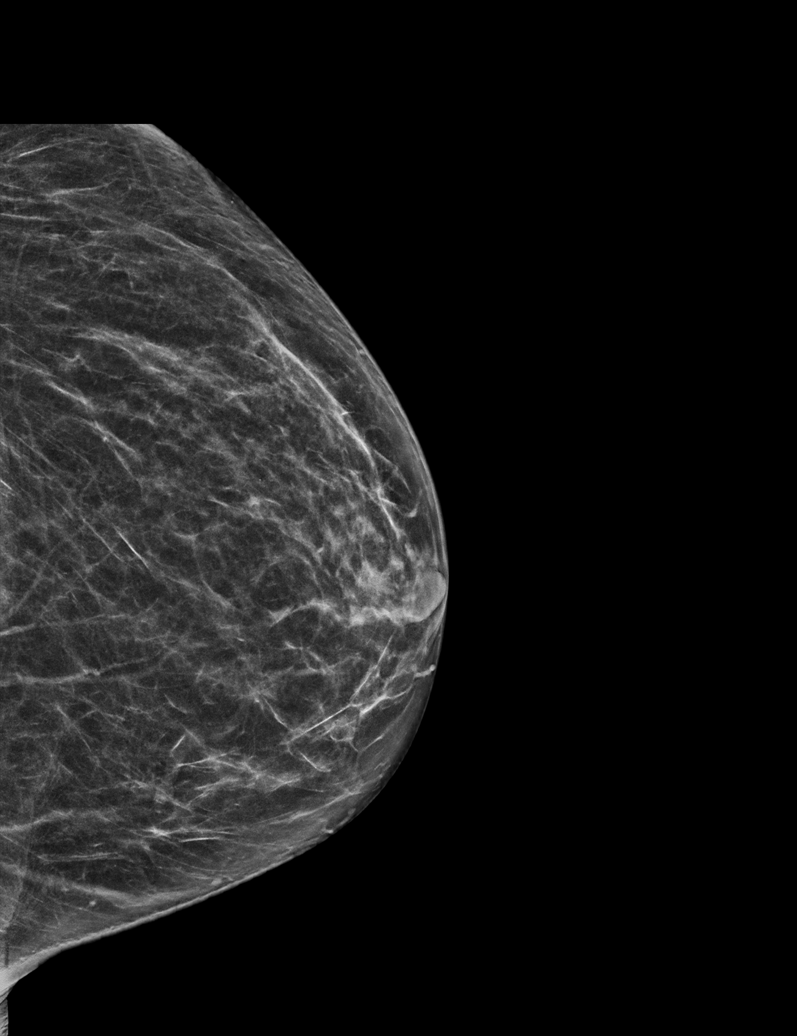

[R CC synth-2D]
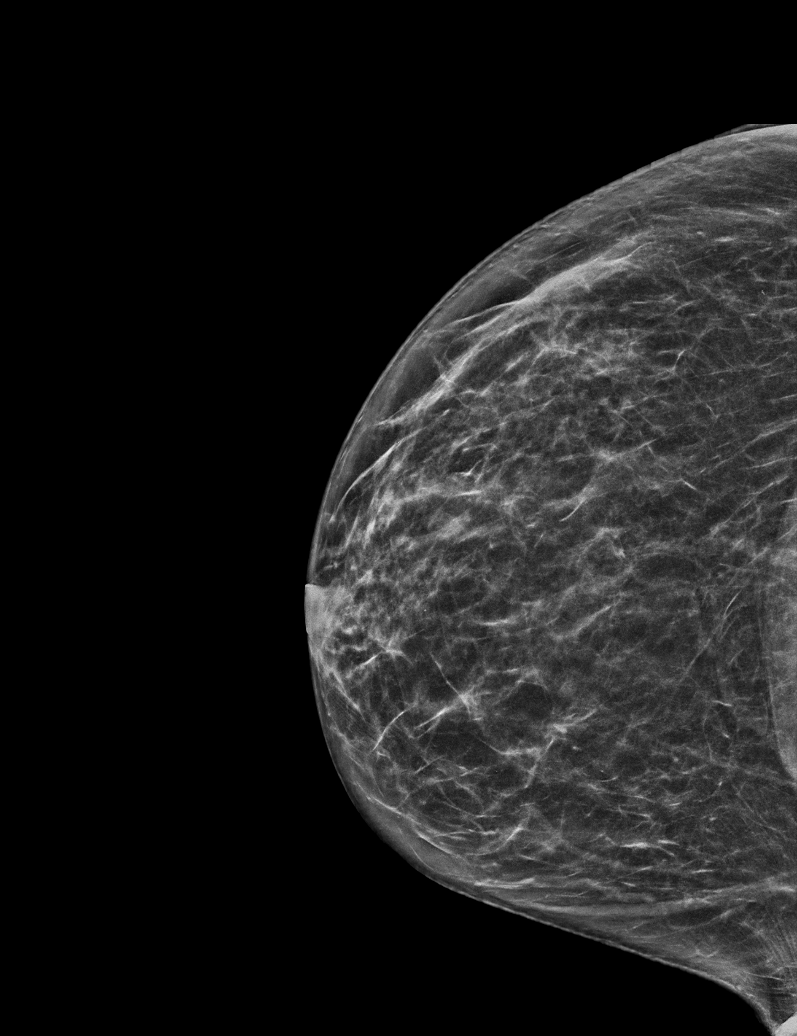

[R MLO synth-2D (2 of 2)]
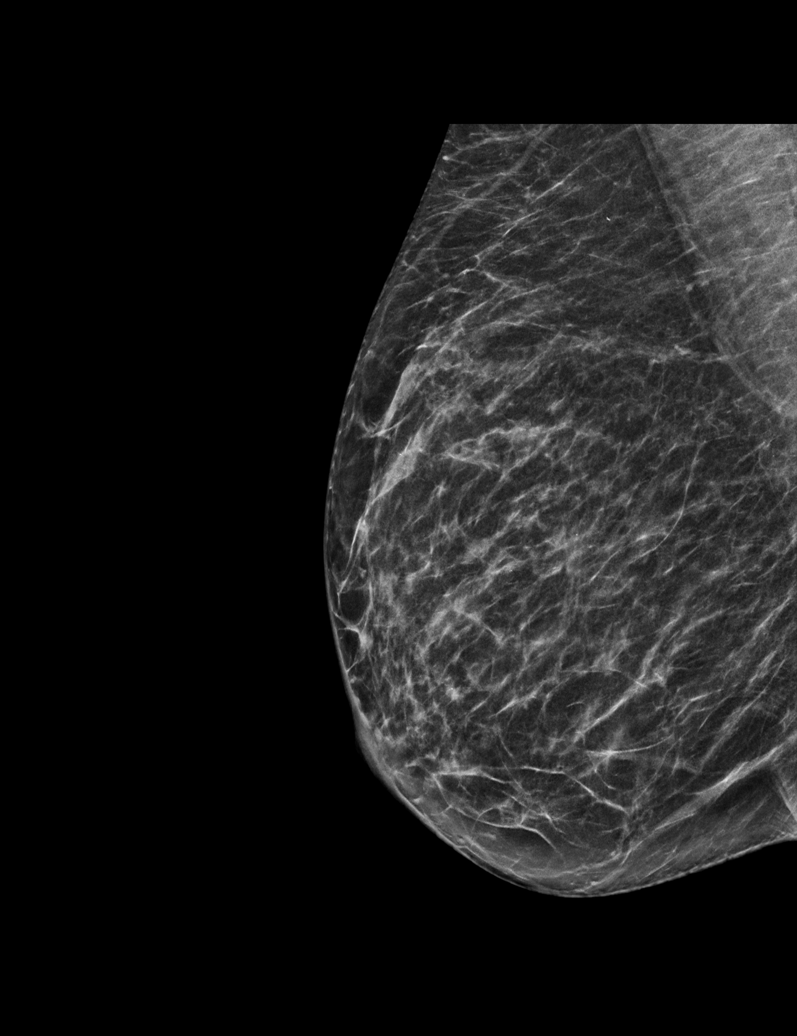

[R MLO tomo · tomo slice 29/58.0]
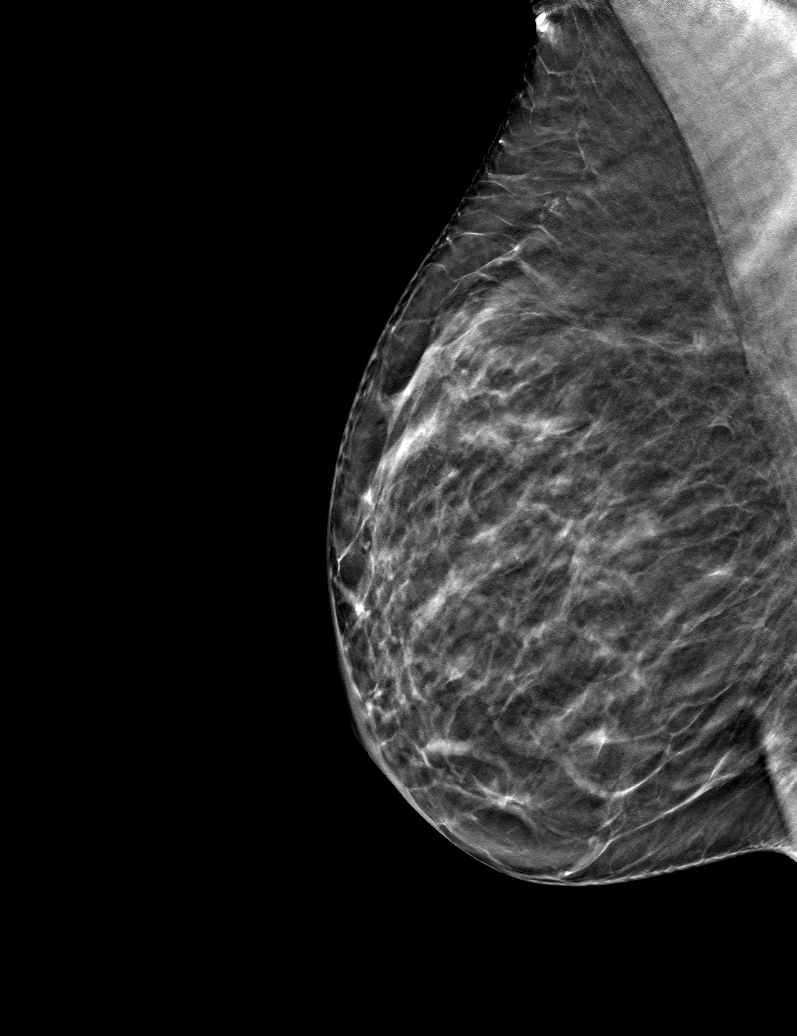

[6 of 30 positions shown; findings below may reference images not displayed]

ACR Breast Density Category b: There are scattered areas of
fibroglandular density.
FINDINGS: There are no findings suspicious for malignancy. The images were
evaluated with computer-aided detection.
IMPRESSION: No mammographic evidence of malignancy. A result letter of this
screening mammogram will be mailed directly to the patient.

RECOMMENDATION:
Screening mammogram in one year. (Code:WJ-I-BG6)

BI-RADS CATEGORY  1: Negative.

## 2022-10-29 ENCOUNTER — Other Ambulatory Visit: Payer: Self-pay

## 2022-10-29 DIAGNOSIS — F419 Anxiety disorder, unspecified: Secondary | ICD-10-CM

## 2022-10-29 MED ORDER — BUPROPION HCL ER (XL) 300 MG PO TB24: 300.0000 mg | ORAL_TABLET | Freq: Every day | ORAL | 3 refills | Status: DC

## 2022-11-08 ENCOUNTER — Other Ambulatory Visit: Payer: Self-pay | Admitting: Family Medicine

## 2022-11-08 DIAGNOSIS — Z1231 Encounter for screening mammogram for malignant neoplasm of breast: Secondary | ICD-10-CM

## 2022-11-12 ENCOUNTER — Telehealth: Payer: Self-pay

## 2022-11-12 NOTE — Telephone Encounter (Signed)
Pt called for RF on her Alprazolam 0.5 mg. Last RF was 08/28/2022. Last OV 08/07/2022.  Pt uses [facility]. Thanks.

## 2022-11-13 ENCOUNTER — Other Ambulatory Visit: Payer: Self-pay | Admitting: Family Medicine

## 2022-11-13 MED ORDER — ALPRAZOLAM 0.5 MG PO TABS: 0.5000 mg | ORAL_TABLET | Freq: Three times a day (TID) | ORAL | 0 refills | Status: DC | PRN

## 2022-12-24 ENCOUNTER — Ambulatory Visit
Admission: RE | Admit: 2022-12-24 | Discharge: 2022-12-24 | Disposition: A | Discharge status: Home / Self Care | Payer: Federal, State, Local not specified - PPO | Source: Ambulatory Visit | Attending: Family Medicine | Admitting: Family Medicine

## 2022-12-24 DIAGNOSIS — Z1231 Encounter for screening mammogram for malignant neoplasm of breast: Secondary | ICD-10-CM | POA: Diagnosis not present

## 2022-12-28 ENCOUNTER — Telehealth: Payer: Self-pay | Admitting: Family Medicine

## 2022-12-28 NOTE — Telephone Encounter (Signed)
Prescription Request  12/28/2022  LOV: 08/07/2022  What is the name of the medication or equipment? ALPRAZolam (XANAX) 0.5 MG tablet   Have you contacted your pharmacy to request a refill? Yes   Which pharmacy would you like this sent to?  [place] ([place]), [place] - [address] [place] ([place] [address] Phone: [phone] Fax: [phone]    Patient notified that their request is being sent to the clinical staff for review and that they should receive a response within 2 business days.   Please advise at [place]

## 2022-12-31 ENCOUNTER — Other Ambulatory Visit: Payer: Self-pay | Admitting: Family Medicine

## 2022-12-31 MED ORDER — ALPRAZOLAM 0.5 MG PO TABS: 0.5000 mg | ORAL_TABLET | Freq: Three times a day (TID) | ORAL | 0 refills | Status: DC | PRN

## 2023-02-12 DIAGNOSIS — H02826 Cysts of left eye, unspecified eyelid: Secondary | ICD-10-CM | POA: Diagnosis not present

## 2023-02-12 DIAGNOSIS — H5203 Hypermetropia, bilateral: Secondary | ICD-10-CM | POA: Diagnosis not present

## 2023-02-12 DIAGNOSIS — H524 Presbyopia: Secondary | ICD-10-CM | POA: Diagnosis not present

## 2023-02-12 DIAGNOSIS — H353131 Nonexudative age-related macular degeneration, bilateral, early dry stage: Secondary | ICD-10-CM | POA: Diagnosis not present

## 2023-02-14 ENCOUNTER — Other Ambulatory Visit: Payer: Self-pay | Admitting: Family Medicine

## 2023-02-14 ENCOUNTER — Telehealth: Payer: Self-pay

## 2023-02-14 MED ORDER — ALPRAZOLAM 0.5 MG PO TABS: 0.5000 mg | ORAL_TABLET | Freq: Three times a day (TID) | ORAL | 0 refills | Status: DC | PRN

## 2023-02-14 NOTE — Telephone Encounter (Signed)
Prescription Request  02/14/2023  LOV: 08/22/22  What is the name of the medication or equipment? ALPRAZolam (XANAX) 0.5 MG tablet [[id number]]  Have you contacted your pharmacy to request a refill? No   Which pharmacy would you like this sent to?  [facility] - [place] ([place]), [place] - [address] [place] ([place]) [place] [address] Phone: [phone] Fax: [phone]    Patient notified that their request is being sent to the clinical staff for review and that they should receive a response within 2 business days.   Please advise at [facility] [phone]

## 2023-03-28 ENCOUNTER — Telehealth: Payer: Self-pay

## 2023-03-28 NOTE — Telephone Encounter (Signed)
Pt called in to request a refill of ALPRAZolam (XANAX) 0.5 MG tablet [[id number]].  LOV: 08/07/22  PHARMACY: [facility] - [address] ([place]), [place] - [address] [place] ([place]) [place] [address] Phone: [phone]  Fax: [phone]    CB#: [phone]

## 2023-04-01 ENCOUNTER — Other Ambulatory Visit: Payer: Self-pay | Admitting: Family Medicine

## 2023-04-01 MED ORDER — ALPRAZOLAM 0.5 MG PO TABS: 0.5000 mg | ORAL_TABLET | Freq: Three times a day (TID) | ORAL | 0 refills | Status: DC | PRN

## 2023-05-08 ENCOUNTER — Telehealth: Payer: Self-pay

## 2023-05-08 NOTE — Telephone Encounter (Signed)
Pt called requesting a refill on her Xanax. Last RF 04/01/2023. Last OV 08/22/2022. Pt uses [facility]. Thank you.

## 2023-05-09 ENCOUNTER — Other Ambulatory Visit: Payer: Self-pay | Admitting: Family Medicine

## 2023-05-09 MED ORDER — ALPRAZOLAM 0.5 MG PO TABS: 0.5000 mg | ORAL_TABLET | Freq: Three times a day (TID) | ORAL | 2 refills | Status: DC | PRN

## 2023-07-09 ENCOUNTER — Telehealth: Payer: Self-pay

## 2023-07-09 ENCOUNTER — Other Ambulatory Visit: Payer: Self-pay | Admitting: Family Medicine

## 2023-07-09 MED ORDER — ALPRAZOLAM 0.5 MG PO TABS: 0.5000 mg | ORAL_TABLET | Freq: Three times a day (TID) | ORAL | 2 refills | Status: DC | PRN

## 2023-07-09 NOTE — Telephone Encounter (Signed)
Pt called in to request a refill of this med:   ALPRAZolam Prudy Feeler) 0.5 MG tablet [[id number]]  LOV: 08/07/22   PHARMACY:  [facility] - [place] ([place]), [place] - [address] [place] ([place]) [place] [address] Phone: [phone]  Fax: [phone]    CB#: [phone]

## 2023-10-20 ENCOUNTER — Other Ambulatory Visit: Payer: Self-pay | Admitting: Family Medicine

## 2023-10-20 DIAGNOSIS — F419 Anxiety disorder, unspecified: Secondary | ICD-10-CM

## 2023-10-31 ENCOUNTER — Encounter: Payer: Self-pay | Admitting: Family Medicine

## 2023-10-31 ENCOUNTER — Ambulatory Visit (INDEPENDENT_AMBULATORY_CARE_PROVIDER_SITE_OTHER): Payer: Federal, State, Local not specified - PPO | Admitting: Family Medicine

## 2023-10-31 VITALS — BP 132/72 | HR 79 | Temp 97.9°F | Ht 63.0 in | Wt 172.0 lb

## 2023-10-31 DIAGNOSIS — Z13220 Encounter for screening for lipoid disorders: Secondary | ICD-10-CM

## 2023-10-31 DIAGNOSIS — D229 Melanocytic nevi, unspecified: Secondary | ICD-10-CM

## 2023-10-31 DIAGNOSIS — Z23 Encounter for immunization: Secondary | ICD-10-CM | POA: Diagnosis not present

## 2023-10-31 DIAGNOSIS — Z0000 Encounter for general adult medical examination without abnormal findings: Secondary | ICD-10-CM

## 2023-10-31 DIAGNOSIS — Z0001 Encounter for general adult medical examination with abnormal findings: Secondary | ICD-10-CM | POA: Diagnosis not present

## 2023-10-31 DIAGNOSIS — F419 Anxiety disorder, unspecified: Secondary | ICD-10-CM

## 2023-10-31 MED ORDER — BUPROPION HCL ER (XL) 300 MG PO TB24: 300.0000 mg | ORAL_TABLET | Freq: Every day | ORAL | 3 refills | Status: AC

## 2023-10-31 MED ORDER — ALPRAZOLAM 0.5 MG PO TABS: 0.5000 mg | ORAL_TABLET | Freq: Three times a day (TID) | ORAL | 2 refills | Status: DC | PRN

## 2023-10-31 NOTE — Progress Notes (Signed)
Subjective:    Patient ID: Chloe Newton, female    DOB: January 20, 1961, 63 y.o.   MRN: [id number]  HPI  Patient is a 63 year old Caucasian female who is here today for complete physical exam.  Her mammogram was last performed in March 2024 and is up-to-date.  Her last colonoscopy was in 2019 and is up-to-date.  She quit smoking in 2000 and therefore does not require lung cancer screening.  She is overdue for a Pap smear.  I recommended this today but she would like to defer this to a later date.  She is due for the shingles vaccine as well flu shot.  She would like the flu shot today but she defers shingles shot.  Of note, she has noticed a mole on the posterior aspect of her left knee.  In the popliteal fossa, there is a 4 mm dark well-circumscribed circular lesion.  I believe it is a early seborrheic keratosis however I recommended a biopsy given the dark color.  Furthermore this is slightly irregular.  Patient would like to see a dermatologist for a second opinion. 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 Positive HPV 16, 18   Anxiety    Fever blister    History of colposcopy with cervical biopsy 12/07/11   Sleep apnea    Phreesia 08/09/2020   Past Surgical History:  Procedure Laterality Date   BARIATRIC SURGERY     RNY   CESAREAN SECTION     TUBAL LIGATION     Current Outpatient Medications on File Prior to Visit  Medication Sig Dispense Refill   Calcium Carb-Cholecalciferol (CALCIUM 1000 + D PO) Take by mouth.     cholecalciferol (VITAMIN D) 1000 units tablet Take 1 tablet (1,000 Units total) by mouth daily.     Multiple Vitamin (MULTIVITAMIN) tablet Take 1 tablet by mouth daily.     Omega-3 Fatty Acids (FISH OIL) 1200 MG CAPS Take 2 capsules by mouth daily.     zinc gluconate 50 MG tablet Take 50 mg by mouth daily.     No current facility-administered medications on file prior to visit.   No Known  Allergies Social History   Socioeconomic History   Marital status: Married    Spouse name: Not on file   Number of children: Not on file   Years of education: Not on file   Highest education level: 12th grade  Occupational History   Not on file  Tobacco Use   Smoking status: Former   Smokeless tobacco: Never  Vaping Use   Vaping status: Former  Substance and Sexual Activity   Alcohol use: Yes   Drug use: No   Sexual activity: Yes    Birth control/protection: Post-menopausal    Comment: 1st intercourse 63 yo-More than 5 partners  Other Topics Concern   Not on file  Social History Narrative   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   For exercise she walks 30-45 minutes 3-4 days per week. (As of 03/2014)   Social Drivers of Health   Financial Resource Strain: Low Risk  (10/24/2023)   Overall Financial Resource Strain (CARDIA)    Difficulty of Paying Living Expenses: Not hard at all  Food Insecurity: No Food Insecurity (10/24/2023)   Hunger Vital Sign    Worried About Running Out of Food in the Last Year: Never true    Ran Out of  Food in the Last Year: Never true  Transportation Needs: No Transportation Needs (10/24/2023)   PRAPARE - Administrator, Civil Service (Medical): No    Lack of Transportation (Non-Medical): No  Physical Activity: Insufficiently Active (10/24/2023)   Exercise Vital Sign    Days of Exercise per Week: 1 day    Minutes of Exercise per Session: 20 min  Stress: No Stress Concern Present (10/24/2023)   [employer] of Occupational Health - Occupational Stress Questionnaire    Feeling of Stress : Not at all  Social Connections: Moderately Isolated (10/24/2023)   Social Connection and Isolation Panel [NHANES]    Frequency of Communication with Friends and Family: More than three times a week    Frequency of Social Gatherings with Friends and Family: Twice a week    Attends Religious Services: Never    Automotive engineer or Organizations: No    Attends Engineer, structural: Not on file    Marital Status: Married  Intimate Partner Violence: Unknown (06/12/2023)   Received from [facility]   HITS    Physically Hurt: Not on file    Insult or Talk Down To: Not on file    Threaten Physical Harm: Not on file    Scream or Curse: Not on file     Review of Systems  All other systems reviewed and are negative.      Objective:   Physical Exam Vitals reviewed.  Constitutional:      General: She is not in acute distress.    Appearance: Normal appearance. She is normal weight. She is not ill-appearing, toxic-appearing or diaphoretic.  HENT:     Head: Normocephalic and atraumatic.     Right Ear: Tympanic membrane and ear canal normal.     Left Ear: Tympanic membrane and ear canal normal.     Nose: Nose normal. No congestion or rhinorrhea.     Mouth/Throat:     Mouth: Mucous membranes are moist.     Pharynx: Oropharynx is clear. No oropharyngeal exudate or posterior oropharyngeal erythema.  Eyes:     General:        Right eye: No discharge.        Left eye: No discharge.     Conjunctiva/sclera: Conjunctivae normal.     Pupils: Pupils are equal, round, and reactive to light.  Neck:     Vascular: No carotid bruit.  Cardiovascular:     Rate and Rhythm: Normal rate and regular rhythm.     Pulses: Normal pulses.     Heart sounds: Normal heart sounds. No murmur heard.    No friction rub. No gallop.  Pulmonary:     Effort: Pulmonary effort is normal. No respiratory distress.     Breath sounds: Normal breath sounds. No stridor. No wheezing, rhonchi or rales.  Chest:     Chest wall: No tenderness.  Abdominal:     General: Abdomen is flat. Bowel sounds are normal. There is no distension.     Palpations: Abdomen is soft.     Tenderness: There is no abdominal tenderness. There is no guarding or rebound.  Musculoskeletal:     Cervical back: Normal range of motion and neck supple.  No rigidity.     Right lower leg: No edema.     Left lower leg: No edema.  Lymphadenopathy:     Cervical: No cervical adenopathy.  Skin:    Coloration: Skin is not jaundiced.     Findings:  No bruising, erythema, lesion or rash.  Neurological:     General: No focal deficit present.     Mental Status: She is alert and oriented to person, place, and time. Mental status is at baseline.     Cranial Nerves: No cranial nerve deficit.     Sensory: No sensory deficit.     Motor: No weakness.     Coordination: Coordination normal.     Gait: Gait normal.     Deep Tendon Reflexes: Reflexes normal.           Assessment & Plan:  Atypical mole - Plan: Ambulatory referral to Dermatology  Screening cholesterol level - Plan: CBC with Differential/Platelet, COMPLETE METABOLIC PANEL WITH GFR, Lipid panel  Anxiety - Plan: buPROPion (WELLBUTRIN XL) 300 MG 24 hr tablet  General medical exam Blood pressure today is acceptable.  Colonoscopy is up-to-date.  Mammogram is due in March.  Recommended a Pap smear.  Patient to resist a later date.  I will schedule the [facility] due to the atypical mole on the posterior aspect of her left knee.  I believe it is an early seborrheic keratosis however it is irregular in its appearance and therefore I recommended a shave biopsy.  She would like to meet with a dermatologist first.  Patient received her flu shot today.  She deferred her shingles vaccine.  We will check a CBC a CMP and a lipid panel.

## 2023-10-31 NOTE — Addendum Note (Signed)
Addended by: Venia Carbon K on: 10/31/2023 12:30 PM   Modules accepted: Orders

## 2023-11-01 ENCOUNTER — Other Ambulatory Visit: Payer: Self-pay

## 2023-11-01 DIAGNOSIS — D649 Anemia, unspecified: Secondary | ICD-10-CM

## 2023-11-03 LAB — CBC WITH DIFFERENTIAL/PLATELET
Absolute Lymphocytes: 2197 {cells}/uL (ref 850–3900)
Absolute Monocytes: 715 {cells}/uL (ref 200–950)
Basophils Absolute: 13 {cells}/uL (ref 0–200)
Basophils Relative: 0.2 %
Eosinophils Absolute: 143 {cells}/uL (ref 15–500)
Eosinophils Relative: 2.2 %
HCT: 32 % — ABNORMAL LOW (ref 35.0–45.0)
Hemoglobin: 10 g/dL — ABNORMAL LOW (ref 11.7–15.5)
MCH: 25.6 pg — ABNORMAL LOW (ref 27.0–33.0)
MCHC: 31.3 g/dL — ABNORMAL LOW (ref 32.0–36.0)
MCV: 82.1 fL (ref 80.0–100.0)
MPV: 10.1 fL (ref 7.5–12.5)
Monocytes Relative: 11 %
Neutro Abs: 3432 {cells}/uL (ref 1500–7800)
Neutrophils Relative %: 52.8 %
Platelets: 351 10*3/uL (ref 140–400)
RBC: 3.9 10*6/uL (ref 3.80–5.10)
RDW: 13.7 % (ref 11.0–15.0)
Total Lymphocyte: 33.8 %
WBC: 6.5 10*3/uL (ref 3.8–10.8)

## 2023-11-03 LAB — LIPID PANEL
Cholesterol: 187 mg/dL (ref <200)
HDL: 92 mg/dL (ref >=50)
LDL Cholesterol (Calc): 81 mg/dL
Non-HDL Cholesterol (Calc): 95 mg/dL (ref <130)
Total CHOL/HDL Ratio: 2 (calc) (ref <5.0)
Triglycerides: 67 mg/dL (ref <150)

## 2023-11-03 LAB — TEST AUTHORIZATION

## 2023-11-03 LAB — COMPLETE METABOLIC PANEL WITH GFR
AG Ratio: 2 (calc) (ref 1.0–2.5)
ALT: 19 U/L (ref 6–29)
AST: 26 U/L (ref 10–35)
Albumin: 4.1 g/dL (ref 3.6–5.1)
Alkaline phosphatase (APISO): 54 U/L (ref 37–153)
BUN: 13 mg/dL (ref 7–25)
CO2: 29 mmol/L (ref 20–32)
Calcium: 9.4 mg/dL (ref 8.6–10.4)
Chloride: 107 mmol/L (ref 98–110)
Creat: 0.86 mg/dL (ref 0.50–1.05)
Globulin: 2.1 g/dL (ref 1.9–3.7)
Glucose, Bld: 79 mg/dL (ref 65–99)
Potassium: 4.8 mmol/L (ref 3.5–5.3)
Sodium: 142 mmol/L (ref 135–146)
Total Bilirubin: 0.3 mg/dL (ref 0.2–1.2)
Total Protein: 6.2 g/dL (ref 6.1–8.1)
eGFR: 76 mL/min/{1.73_m2} (ref >=60)

## 2023-11-03 LAB — IRON,TIBC AND FERRITIN PANEL
%SAT: 6 % — ABNORMAL LOW (ref 16–45)
Ferritin: 5 ng/mL — ABNORMAL LOW (ref 16–288)
Iron: 28 ug/dL — ABNORMAL LOW (ref 45–160)
TIBC: 434 ug/dL (ref 250–450)

## 2023-11-03 LAB — VITAMIN B12: Vitamin B-12: 963 pg/mL (ref 200–1100)

## 2023-11-29 ENCOUNTER — Other Ambulatory Visit: Payer: Self-pay | Admitting: Family Medicine

## 2023-11-29 DIAGNOSIS — Z0000 Encounter for general adult medical examination without abnormal findings: Secondary | ICD-10-CM

## 2023-12-12 ENCOUNTER — Ambulatory Visit

## 2023-12-13 ENCOUNTER — Ambulatory Visit: Admitting: Family Medicine

## 2023-12-25 ENCOUNTER — Ambulatory Visit: Payer: Federal, State, Local not specified - PPO

## 2024-01-20 ENCOUNTER — Other Ambulatory Visit: Payer: Self-pay | Admitting: Family Medicine

## 2024-01-21 NOTE — Telephone Encounter (Signed)
 Requested medication (s) are due for refill today: Yes  Requested medication (s) are on the active medication list: Yes  Last refill:  10/31/23, #30, 2 refills   Future visit scheduled: No  Notes to clinic:  Unable to refill per protocol, cannot delegate.      Requested Prescriptions  Pending Prescriptions Disp Refills   ALPRAZolam  (XANAX ) 0.5 MG tablet [Pharmacy Med Name: ALPRAZolam  0.5 MG Oral Tablet] 30 tablet 0    Sig: Take 1 tablet by mouth three times daily as needed     Not Delegated - Psychiatry: Anxiolytics/Hypnotics 2 Failed - 01/21/2024  3:51 PM      Failed - This refill cannot be delegated      Failed - Urine Drug Screen completed in last 360 days      Failed - Valid encounter within last 6 months    Recent Outpatient Visits           2 months ago Atypical mole   [place] [facility] Pickard, Cisco Crest, MD   1 year ago Sensorineural hearing loss (SNHL) of left ear with unrestricted hearing of right ear   [place] [facility] Jenelle Mis, FNP   1 year ago Colon cancer screening   [place] [facility] Pickard, Cisco Crest, MD              Passed - Patient is not pregnant

## 2024-02-12 ENCOUNTER — Other Ambulatory Visit: Payer: Self-pay | Admitting: Family Medicine

## 2024-02-12 NOTE — Telephone Encounter (Signed)
 Requested medication (s) are due for refill today - yes  Requested medication (s) are on the active medication list -yes  Future visit scheduled -yes  Last refill: 01/23/24 #30  Notes to clinic: non delegated Rx  Requested Prescriptions  Pending Prescriptions Disp Refills   ALPRAZolam  (XANAX ) 0.5 MG tablet [Pharmacy Med Name: ALPRAZolam  0.5 MG Oral Tablet] 30 tablet 0    Sig: Take 1 tablet by mouth three times daily as needed     Not Delegated - Psychiatry: Anxiolytics/Hypnotics 2 Failed - 02/12/2024  9:06 AM      Failed - This refill cannot be delegated      Failed - Urine Drug Screen completed in last 360 days      Failed - Valid encounter within last 6 months    Recent Outpatient Visits           3 months ago Atypical mole   [place] [facility] Pickard, Cisco Crest, MD   1 year ago Sensorineural hearing loss (SNHL) of left ear with unrestricted hearing of right ear   [place] [facility] Jenelle Mis, FNP   1 year ago Colon cancer screening   [place] [facility] Austine Lefort, MD              Passed - Patient is not pregnant         Requested Prescriptions  Pending Prescriptions Disp Refills   ALPRAZolam  (XANAX ) 0.5 MG tablet [Pharmacy Med Name: ALPRAZolam  0.5 MG Oral Tablet] 30 tablet 0    Sig: Take 1 tablet by mouth three times daily as needed     Not Delegated - Psychiatry: Anxiolytics/Hypnotics 2 Failed - 02/12/2024  9:06 AM      Failed - This refill cannot be delegated      Failed - Urine Drug Screen completed in last 360 days      Failed - Valid encounter within last 6 months    Recent Outpatient Visits           3 months ago Atypical mole   [place] [facility] Pickard, Cisco Crest, MD   1 year ago Sensorineural hearing loss (SNHL) of left ear with unrestricted hearing of right ear   [place] [facility] Jenelle Mis, FNP   1 year ago Colon  cancer screening    [facility] Pickard, Cisco Crest, MD              Passed - Patient is not pregnant

## 2024-03-22 ENCOUNTER — Other Ambulatory Visit: Payer: Self-pay | Admitting: Family Medicine

## 2024-04-30 ENCOUNTER — Other Ambulatory Visit: Payer: Self-pay | Admitting: Family Medicine

## 2024-05-01 NOTE — Telephone Encounter (Signed)
 Requested medication (s) are due for refill today: yes  Requested medication (s) are on the active medication list: yes  Last refill:  03/23/24  Future visit scheduled: yes  Notes to clinic:  Unable to refill per protocol, cannot delegate.      Requested Prescriptions  Pending Prescriptions Disp Refills   ALPRAZolam  (XANAX ) 0.5 MG tablet [Pharmacy Med Name: ALPRAZolam  0.5 MG Oral Tablet] 30 tablet 0    Sig: Take 1 tablet by mouth three times daily as needed     Not Delegated - Psychiatry: Anxiolytics/Hypnotics 2 Failed - 05/01/2024  1:46 PM      Failed - This refill cannot be delegated      Failed - Urine Drug Screen completed in last 360 days      Failed - Valid encounter within last 6 months    Recent Outpatient Visits           6 months ago Atypical mole   [place] [facility] Pickard, Butler DASEN, MD   1 year ago Sensorineural hearing loss (SNHL) of left ear with unrestricted hearing of right ear   [place] [facility] Kayla Jeoffrey RAMAN, FNP   1 year ago Colon cancer screening   [place] [facility] Pickard, Butler DASEN, MD              Passed - Patient is not pregnant

## 2024-06-15 ENCOUNTER — Other Ambulatory Visit: Payer: Self-pay | Admitting: Family Medicine

## 2024-06-17 NOTE — Telephone Encounter (Signed)
 Requested medication (s) are due for refill today: yes  Requested medication (s) are on the active medication list: yes  Last refill:  05/01/24  Future visit scheduled: {Yes  Notes to clinic:  Unable to refill per protocol, cannot delegate.      Requested Prescriptions  Pending Prescriptions Disp Refills   ALPRAZolam  (XANAX ) 0.5 MG tablet [Pharmacy Med Name: ALPRAZolam  0.5 MG Oral Tablet] 30 tablet 0    Sig: Take 1 tablet by mouth three times daily as needed     Not Delegated - Psychiatry: Anxiolytics/Hypnotics 2 Failed - 06/17/2024 11:41 AM      Failed - This refill cannot be delegated      Failed - Urine Drug Screen completed in last 360 days      Failed - Valid encounter within last 6 months    Recent Outpatient Visits           7 months ago Atypical mole   [place] [facility] Pickard, Butler DASEN, MD   1 year ago Sensorineural hearing loss (SNHL) of left ear with unrestricted hearing of right ear   [place] [facility] Kayla Jeoffrey RAMAN, FNP   1 year ago Colon cancer screening   [place] [facility] Pickard, Butler DASEN, MD              Passed - Patient is not pregnant

## 2024-08-04 ENCOUNTER — Other Ambulatory Visit: Payer: Self-pay | Admitting: Family Medicine

## 2024-08-04 DIAGNOSIS — H353131 Nonexudative age-related macular degeneration, bilateral, early dry stage: Secondary | ICD-10-CM | POA: Diagnosis not present

## 2024-08-06 ENCOUNTER — Ambulatory Visit
Admission: RE | Admit: 2024-08-06 | Discharge: 2024-08-06 | Disposition: A | Discharge status: Home / Self Care | Source: Ambulatory Visit | Attending: Family Medicine | Admitting: Family Medicine

## 2024-08-06 DIAGNOSIS — Z1231 Encounter for screening mammogram for malignant neoplasm of breast: Secondary | ICD-10-CM | POA: Diagnosis not present

## 2024-08-06 DIAGNOSIS — Z0000 Encounter for general adult medical examination without abnormal findings: Secondary | ICD-10-CM

## 2024-08-19 ENCOUNTER — Other Ambulatory Visit: Payer: Self-pay | Admitting: Family Medicine

## 2024-08-21 NOTE — Telephone Encounter (Signed)
 Requested medications are due for refill today.  yes  Requested medications are on the active medications list.  yes  Last refill. 08/07/2024 #30 0 rf  Future visit scheduled.   yes  Notes to clinic.  Refill not delegated.    Requested Prescriptions  Pending Prescriptions Disp Refills   ALPRAZolam  (XANAX ) 0.5 MG tablet [Pharmacy Med Name: ALPRAZolam  0.5 MG Oral Tablet] 30 tablet 0    Sig: Take 1 tablet by mouth three times daily as needed     Not Delegated - Psychiatry: Anxiolytics/Hypnotics 2 Failed - 08/21/2024  1:47 PM      Failed - This refill cannot be delegated      Failed - Urine Drug Screen completed in last 360 days      Failed - Valid encounter within last 6 months    Recent Outpatient Visits           9 months ago Atypical mole   [place] [facility] Duanne Butler DASEN, MD   2 years ago Sensorineural hearing loss (SNHL) of left ear with unrestricted hearing of right ear   [place] [facility] Kayla Jeoffrey RAMAN, FNP   2 years ago Colon cancer screening    [facility] Pickard, Butler DASEN, MD              Passed - Patient is not pregnant

## 2024-09-26 ENCOUNTER — Other Ambulatory Visit: Payer: Self-pay | Admitting: Family Medicine

## 2024-10-22 ENCOUNTER — Other Ambulatory Visit: Payer: Self-pay | Admitting: Family Medicine

## 2024-11-03 ENCOUNTER — Ambulatory Visit (INDEPENDENT_AMBULATORY_CARE_PROVIDER_SITE_OTHER): Payer: Federal, State, Local not specified - PPO | Admitting: Family Medicine

## 2024-11-03 VITALS — BP 142/80 | HR 66 | Temp 98.2°F | Resp 16 | Wt 179.0 lb

## 2024-11-03 DIAGNOSIS — D649 Anemia, unspecified: Secondary | ICD-10-CM

## 2024-11-03 DIAGNOSIS — Z1211 Encounter for screening for malignant neoplasm of colon: Secondary | ICD-10-CM

## 2024-11-03 DIAGNOSIS — Z780 Asymptomatic menopausal state: Secondary | ICD-10-CM | POA: Diagnosis not present

## 2024-11-03 DIAGNOSIS — Z0001 Encounter for general adult medical examination with abnormal findings: Secondary | ICD-10-CM

## 2024-11-03 DIAGNOSIS — Z0000 Encounter for general adult medical examination without abnormal findings: Secondary | ICD-10-CM

## 2024-11-03 LAB — CBC WITH DIFFERENTIAL/PLATELET
Absolute Lymphocytes: 2033 {cells}/uL (ref 850–3900)
Absolute Monocytes: 728 {cells}/uL (ref 200–950)
Basophils Absolute: 8 {cells}/uL (ref 0–200)
Basophils Relative: 0.1 %
Eosinophils Absolute: 150 {cells}/uL (ref 15–500)
Eosinophils Relative: 2 %
HCT: 34.8 % — ABNORMAL LOW (ref 35.9–46.0)
Hemoglobin: 11.7 g/dL (ref 11.7–15.5)
MCH: 30.8 pg (ref 27.0–33.0)
MCHC: 33.6 g/dL (ref 31.6–35.4)
MCV: 91.6 fL (ref 81.4–101.7)
MPV: 9.5 fL (ref 7.5–12.5)
Monocytes Relative: 9.7 %
Neutro Abs: 4583 {cells}/uL (ref 1500–7800)
Neutrophils Relative %: 61.1 %
Platelets: 316 10*3/uL (ref 140–400)
RBC: 3.8 Million/uL (ref 3.80–5.10)
RDW: 11.3 % (ref 11.0–15.0)
Total Lymphocyte: 27.1 %
WBC: 7.5 10*3/uL (ref 3.8–10.8)

## 2024-11-03 LAB — COMPREHENSIVE METABOLIC PANEL WITH GFR
AG Ratio: 1.7 (calc) (ref 1.0–2.5)
ALT: 35 U/L — ABNORMAL HIGH (ref 6–29)
AST: 34 U/L (ref 10–35)
Albumin: 3.9 g/dL (ref 3.6–5.1)
Alkaline phosphatase (APISO): 61 U/L (ref 37–153)
BUN: 20 mg/dL (ref 7–25)
CO2: 29 mmol/L (ref 20–32)
Calcium: 9.2 mg/dL (ref 8.6–10.4)
Chloride: 102 mmol/L (ref 98–110)
Creat: 0.91 mg/dL (ref 0.50–1.05)
Globulin: 2.3 g/dL (ref 1.9–3.7)
Glucose, Bld: 82 mg/dL (ref 65–99)
Potassium: 4.4 mmol/L (ref 3.5–5.3)
Sodium: 138 mmol/L (ref 135–146)
Total Bilirubin: 0.5 mg/dL (ref 0.2–1.2)
Total Protein: 6.2 g/dL (ref 6.1–8.1)
eGFR: 70 mL/min/{1.73_m2}

## 2024-11-03 LAB — IRON: Iron: 179 ug/dL — ABNORMAL HIGH (ref 45–160)

## 2024-11-03 LAB — LIPID PANEL
Cholesterol: 192 mg/dL
HDL: 92 mg/dL
LDL Cholesterol (Calc): 77 mg/dL
Non-HDL Cholesterol (Calc): 100 mg/dL
Total CHOL/HDL Ratio: 2.1 (calc)
Triglycerides: 124 mg/dL

## 2024-11-03 MED ORDER — OMEPRAZOLE 20 MG PO CPDR: 20.0000 mg | DELAYED_RELEASE_CAPSULE | Freq: Every day | ORAL | 3 refills | Status: AC

## 2024-11-04 ENCOUNTER — Ambulatory Visit: Payer: Self-pay | Admitting: Family Medicine

## 2024-12-14 ENCOUNTER — Other Ambulatory Visit (HOSPITAL_COMMUNITY)

## 2025-11-04 ENCOUNTER — Encounter: Admitting: Family Medicine
